# Patient Record
Sex: Male | Born: 1970 | Race: White
Health system: Southern US, Community
[De-identification: ages and names within clinical notes are randomized; demographics above are authoritative.]

## PROBLEM LIST (undated history)

## (undated) DIAGNOSIS — F1020 Alcohol dependence, uncomplicated: Secondary | ICD-10-CM

## (undated) DIAGNOSIS — I219 Acute myocardial infarction, unspecified: Secondary | ICD-10-CM

## (undated) DIAGNOSIS — I629 Nontraumatic intracranial hemorrhage, unspecified: Secondary | ICD-10-CM

## (undated) DIAGNOSIS — R319 Hematuria, unspecified: Secondary | ICD-10-CM

## (undated) DIAGNOSIS — K7460 Unspecified cirrhosis of liver: Secondary | ICD-10-CM

## (undated) HISTORY — DX: Nontraumatic intracranial hemorrhage, unspecified: I62.9

## (undated) HISTORY — PX: NASAL SINUS SURGERY: SHX719

---

## 2006-12-02 ENCOUNTER — Emergency Department (HOSPITAL_COMMUNITY): Admission: EM | Admit: 2006-12-02 | Discharge: 2006-12-02 | Payer: Self-pay | Admitting: Emergency Medicine

## 2006-12-08 ENCOUNTER — Emergency Department (HOSPITAL_COMMUNITY): Admission: EM | Admit: 2006-12-08 | Discharge: 2006-12-08 | Payer: Self-pay | Admitting: Emergency Medicine

## 2014-10-25 ENCOUNTER — Emergency Department
Admission: EM | Admit: 2014-10-25 | Discharge: 2014-10-26 | Disposition: A | Discharge status: Home / Self Care | Payer: Self-pay | Attending: Emergency Medicine | Admitting: Emergency Medicine

## 2014-10-25 ENCOUNTER — Emergency Department: Payer: Self-pay

## 2014-10-25 DIAGNOSIS — K7460 Unspecified cirrhosis of liver: Secondary | ICD-10-CM | POA: Insufficient documentation

## 2014-10-25 DIAGNOSIS — F10220 Alcohol dependence with intoxication, uncomplicated: Secondary | ICD-10-CM

## 2014-10-25 DIAGNOSIS — F10120 Alcohol abuse with intoxication, uncomplicated: Secondary | ICD-10-CM

## 2014-10-25 DIAGNOSIS — F10129 Alcohol abuse with intoxication, unspecified: Secondary | ICD-10-CM | POA: Insufficient documentation

## 2014-10-25 HISTORY — DX: Unspecified cirrhosis of liver: K74.60

## 2014-10-25 HISTORY — DX: Alcohol dependence, uncomplicated: F10.20

## 2014-10-25 HISTORY — DX: Hematuria, unspecified: R31.9

## 2014-10-25 LAB — CBC AND DIFFERENTIAL
Basophils Absolute Automated: 0.06 10*3/uL (ref 0.00–0.20)
Basophils Automated: 1 %
Eosinophils Absolute Automated: 0.06 10*3/uL (ref 0.00–0.70)
Eosinophils Automated: 1 %
Hematocrit: 48.3 % (ref 42.0–52.0)
Hgb: 16.8 g/dL (ref 13.0–17.0)
Immature Granulocytes Absolute: 0.01 10*3/uL
Immature Granulocytes: 0 %
Lymphocytes Absolute Automated: 3.35 10*3/uL (ref 0.50–4.40)
Lymphocytes Automated: 53 %
MCH: 32.4 pg — ABNORMAL HIGH (ref 28.0–32.0)
MCHC: 34.8 g/dL (ref 32.0–36.0)
MCV: 93.1 fL (ref 80.0–100.0)
MPV: 10.3 fL (ref 9.4–12.3)
Monocytes Absolute Automated: 0.48 10*3/uL (ref 0.00–1.20)
Monocytes: 8 %
Neutrophils Absolute: 2.35 10*3/uL (ref 1.80–8.10)
Neutrophils: 37 %
Nucleated RBC: 0 /100 WBC (ref 0–1)
Platelets: 323 10*3/uL (ref 140–400)
RBC: 5.19 10*6/uL (ref 4.70–6.00)
RDW: 14 % (ref 12–15)
WBC: 6.3 10*3/uL (ref 3.50–10.80)

## 2014-10-25 MED ORDER — SODIUM CHLORIDE 0.9 % IV BOLUS
1000.0000 mL | Freq: Once | INTRAVENOUS | Status: AC
Start: 2014-10-25 — End: 2014-10-26
  Administered 2014-10-25: 1000 mL via INTRAVENOUS

## 2014-10-25 MED ORDER — FAMOTIDINE 10 MG/ML IV SOLN (WRAP)
20.0000 mg | Freq: Once | INTRAVENOUS | Status: AC
Start: 2014-10-25 — End: 2014-10-25
  Administered 2014-10-25: 20 mg via INTRAVENOUS
  Filled 2014-10-25: qty 2

## 2014-10-25 MED ORDER — ONDANSETRON HCL 4 MG/2ML IJ SOLN
4.0000 mg | Freq: Once | INTRAMUSCULAR | Status: AC
Start: 2014-10-25 — End: 2014-10-25
  Administered 2014-10-25: 4 mg via INTRAVENOUS
  Filled 2014-10-25: qty 2

## 2014-10-25 NOTE — ED Notes (Signed)
Pt brought into ED from home by niece after pt vomiting and urinating blood and c/o of severe abd pains. Pt Spanish speaking only, niece translating, refuses facility interpreter. Niece describes uncle as having been a "binge drinker my whole life" and has been binge drinking currently for 1 month. Niece reports pt drinking approx 30 beers/day and a fifth of liquor/day. Pt appears tearful, but cooperative.

## 2014-10-25 NOTE — ED Provider Notes (Signed)
EMERGENCY DEPARTMENT HISTORY AND PHYSICAL EXAM    Date: 10/25/2014   Physician/Midlevel provider first contact with patient: 10/25/14 2243       Patient Name: Austin Reilly  Attending Physician: French Ana, MD  Mid-level: Tula Nakayama, PA-C      History of Presenting Illness     Chief Complaint   Patient presents with   . Alcohol Intoxication       History Provided By: patient and niece    Chief Complaint: alcohol abuse     Austin Reilly is a 44 y.o. male who presents with his niece for eval of alcohol abuse and pt wanting detox.  Per niece, pt has been drinking daily and getting drunk for a long time.  Pt has been drinking so much he urinates on himself.  Pt drinks liquor and beer.  Pt has recent begun to vomit and have blood in his stool.  Pt states he does have intermittent abd pain.  Pt denies any chest pain, SOB, back pain, or changes in urination.  Pt does not work due to workers comp injury and receives payment for this.  Pt does not drive.  Pt lives with his niece.    PCP: Pcp, Noneorunknown, [place]    No current facility-administered medications for this encounter.     No current outpatient prescriptions on file.       Past Medical History     Past Medical History   Diagnosis Date   . Cirrhosis    . Alcoholism    . Hematuria      History reviewed. No pertinent past surgical history.    Family History     No family history on file.    Social History     History     Social History   . Marital Status: Single     Spouse Name: N/A     Number of Children: N/A   . Years of Education: N/A     Social History Main Topics   . Smoking status: Never Smoker    . Smokeless tobacco: Not on file   . Alcohol Use: 138.0 oz/week     210 Cans of beer, 20 Shots of liquor per week   . Drug Use: Not on file   . Sexual Activity: Not on file     Other Topics Concern   . Not on file     Social History Narrative   . No narrative on file         Allergies     No Known Allergies    Review of Systems     Review of Systems   Constitutional:  Negative for fever.   HENT: Negative for nosebleeds.    Respiratory: Negative for shortness of breath.    Cardiovascular: Negative for chest pain and leg swelling.   Gastrointestinal: Positive for nausea, vomiting, abdominal pain and blood in stool.   Musculoskeletal: Negative for back pain, joint pain, falls and neck pain.   Neurological: Negative for dizziness, tingling, seizures, loss of consciousness and headaches.   Endo/Heme/Allergies: Does not bruise/bleed easily.   Psychiatric/Behavioral: Positive for substance abuse.   All other systems reviewed and are negative.          Physical Exam   BP 122/68 mmHg  Pulse 109  Temp(Src) 98.1 F (36.7 C) (Oral)  Resp 18  SpO2 95%    CONSTITUTIONAL:  Oriented to person, place, and time.  Well-developed and well-nourished.  No acute distress. Non-toxic appearance.  Vital signs reviewed.  Appears intoxicated.  RESP:  No respiratory distress.  Breath sounds with no rales, wheezing, or rhonchi. No chest wall tenderness.  CARDIAC:  Regular rate and rhythm.  Heart sounds with no murmurs, gallop, or friction rub.  ABD:  Normal bowel sounds x4 quadrants.  Soft.  Mild general abd tenderness..  Nondistended.  No organomegaly.  No peritoneal signs.  No signs of trauma or contusions.  No palpable pulsatile masses.    BACK:  No CVA tenderness.  Full range of motion without pain.  No spinous process tenderness, muscular tenderness, or spasm present.  MS:  No lacerations, contusions, or deformity.  Full active and passive range of motion. 2+ pulses.  No edema or calf tenderness.   SKIN:  Skin is warm and dry.  No rashes, lesions, contusions, or drainage.  NEURO:  A0X3.  Sensory intact throughout  PSYCH:  Mood, memory, affect, and judgment normal for age.      Diagnostic Study Results     Labs -     Results     Procedure Component Value Units Date/Time    Type and Screen [[id number]] Collected:  10/25/14 2319    Specimen Information:  Blood Updated:  10/26/14 0007     ABO Rh O POS       AB Screen Gel NEG     Comprehensive Metabolic Panel (CMP) [[id number]]  (Abnormal) Collected:  10/25/14 2319    Specimen Information:  Blood Updated:  10/26/14 0000     Glucose 113 (H) mg/dL      BUN 8.0 (L) mg/dL      Creatinine 0.9 mg/dL      Sodium 782 mEq/L      Potassium 3.9 mEq/L      Chloride 102 mEq/L      CO2 27 mEq/L      CALCIUM 9.3 mg/dL      Protein, Total 8.6 (H) g/dL      Albumin 4.2 g/dL      AST (SGOT) 83 (H) U/L      ALT 101 (H) U/L      Alkaline Phosphatase 100 U/L      Bilirubin, Total 1.0 mg/dL      Globulin 4.4 (H) g/dL      Albumin/Globulin Ratio 1.0      Anion Gap 13.0     Lipase [[id number]] Collected:  10/25/14 2319    Specimen Information:  Blood Updated:  10/26/14 0000     Lipase 38 U/L     Alcohol (Ethanol) Level [[id number]]  (Abnormal) Collected:  10/25/14 2319    Specimen Information:  Blood Updated:  10/26/14 0000     Alcohol 438 (HH) mg/dL     Hemolysis index [[id number]] Collected:  10/25/14 2319     Hemolysis Index 10 Updated:  10/26/14 0000    GFR [[id number]] Collected:  10/25/14 2319     EGFR >60.0 Updated:  10/26/14 0000    CBC and differential [[id number]]  (Abnormal) Collected:  10/25/14 2319    Specimen Information:  Blood / Blood Updated:  10/25/14 2327     WBC 6.30 x10 3/uL      Hgb 16.8 g/dL      Hematocrit 59.5 %      Platelets 323 x10 3/uL      RBC 5.19 x10 6/uL      MCV 93.1 fL      MCH 32.4 (H) pg      MCHC 34.8 g/dL  RDW 14 %      MPV 10.3 fL      Neutrophils 37 %      Lymphocytes Automated 53 %      Monocytes 8 %      Eosinophils Automated 1 %      Basophils Automated 1 %      Immature Granulocyte 0 %      Nucleated RBC 0 /100 WBC      Neutrophils Absolute 2.35 x10 3/uL      Abs Lymph Automated 3.35 x10 3/uL      Abs Mono Automated 0.48 x10 3/uL      Abs Eos Automated 0.06 x10 3/uL      Absolute Baso Automated 0.06 x10 3/uL      Absolute Immature Granulocyte 0.01 x10 3/uL           Radiologic Studies -   Radiology Results (24 Hour)     ** No results found for the  last 24 hours. **          Clinical Course in the Emergency Department     10:57 PM  Labs, Pepcid 20mg  IV, Zofran 4mg  IV, and NaCl 1 liter bolus ordered.    12:08 AM  Reviewed workup with pt and niece.  Will continue to hydrate and re-eval.    1:21 AM  NaCl #2 ordered.    3:36 AM  Reviewed with pt and niece.  Pt feeling better and able to converse without difficulty.  Pt without any n/v/d or bleeding in ED.  Niece willing to take pt home to [place].  Suggest f/u with detox in [place].  Advised to push PO and no alcohol use.  Return for worse pain, n/v, bleeding, shakes, withdrawal, or problems.  Reviewed case with Dr Johny Drilling.      Medical Decision Making     Differential diagnosis considered in this case:  Alcohol intoxication/abuse, GI bleed, hepatitis      I reviewed the vital signs, available nursing notes, past medical history, past surgical history, family history and social history.    Vital Signs-Reviewed the patient's vital signs.     Patient Vitals for the past 12 hrs:   BP Temp Pulse Resp   10/26/14 0132 122/68 mmHg - (!) 109 18   10/25/14 2256 (!) 164/110 mmHg 98.1 F (36.7 C) (!) 122 20         Diagnosis and Treatment Plan       Clinical Impression:   1. Alcohol intoxication in active alcoholic without complication            Attending's Note (MLP) Attestestation           Tula Nakayama, PA  10/26/14 1610    French Ana, MD  10/27/14 [phone]

## 2014-10-26 LAB — COMPREHENSIVE METABOLIC PANEL
ALT: 101 U/L — ABNORMAL HIGH (ref 0–55)
AST (SGOT): 83 U/L — ABNORMAL HIGH (ref 5–34)
Albumin/Globulin Ratio: 1 (ref 0.9–2.2)
Albumin: 4.2 g/dL (ref 3.5–5.0)
Alkaline Phosphatase: 100 U/L (ref 38–106)
Anion Gap: 13 (ref 5.0–15.0)
BUN: 8 mg/dL — ABNORMAL LOW (ref 9.0–28.0)
Bilirubin, Total: 1 mg/dL (ref 0.2–1.2)
CO2: 27 mEq/L (ref 22–29)
Calcium: 9.3 mg/dL (ref 8.5–10.5)
Chloride: 102 mEq/L (ref 100–111)
Creatinine: 0.9 mg/dL (ref 0.7–1.3)
Globulin: 4.4 g/dL — ABNORMAL HIGH (ref 2.0–3.6)
Glucose: 113 mg/dL — ABNORMAL HIGH (ref 70–100)
Potassium: 3.9 mEq/L (ref 3.5–5.1)
Protein, Total: 8.6 g/dL — ABNORMAL HIGH (ref 6.0–8.3)
Sodium: 142 mEq/L (ref 136–145)

## 2014-10-26 LAB — TYPE AND SCREEN
AB Screen Gel: NEGATIVE
ABO Rh: O POS

## 2014-10-26 LAB — GFR: EGFR: >60

## 2014-10-26 LAB — HEMOLYSIS INDEX: Hemolysis Index: 10 (ref 0–18)

## 2014-10-26 LAB — ETHANOL: Alcohol: 438 mg/dL

## 2014-10-26 LAB — LIPASE: Lipase: 38 U/L (ref 8–78)

## 2014-10-26 MED ORDER — SODIUM CHLORIDE 0.9 % IV BOLUS
1000.0000 mL | Freq: Once | INTRAVENOUS | Status: AC
Start: 2014-10-26 — End: 2014-10-26
  Administered 2014-10-26: 1000 mL via INTRAVENOUS

## 2014-10-26 NOTE — Discharge Instructions (Signed)
Intoxicacin por alcohol     Alcohol Intoxication     1.  Usted ha sido atendido por intoxicacin alcohlica.   1.  You have been seen for alcohol intoxication.             2.  La forma en que consumi alcohol hoy sugiere que puede tener un problema con el alcoholismo.   2.  Using alcohol as you did today suggests you might have a problem with alcohol abuse.             3.  El alcoholismo puede causar muchos problemas, incluyendo enfermedad del hgado, lceras estomacales y pancreatitis.   3.  Alcohol abuse can cause many problems including liver disease, stomach ulcers and pancreatitis.             4.  Consumir alcohol en exceso puede causarle un paro respiratorio y Musician.   4.  Drinking enough alcohol can cause you to stop breathing and die.             5.  Afortunadamente, hoy no sufri ninguna complicacin que ponga en riesgo su vida.   5.  Fortunately, you did not suffer any life-threatening complications today.             6.  NO CONDUZCA BAJO LOS EFECTOS DEL ALCOHOL! PUEDE LESIONARSE O MORIRSE, O BIEN LESIONAR O MATAR A ALGUIEN MS SI BEBE Y CONDUCE.   6.  DO NOT DRIVE A VEHICLE UNDER THE INFLUENCE OF ALCOHOL! YOU MIGHT INJURE OR KILL YOURSELF OR SOMEONE ELSE IF YOU DRINK AND DRIVE.             7.  DEBE BUSCAR ATENCIN MDICA INMEDIATAMENTE, AQU O EN LA SALA DE EMERGENCIAS MS CERCANA, SI SE PRESENTA CUALQUIERA DE LAS SIGUIENTES SITUACIONES:   7.  YOU SHOULD SEEK MEDICAL ATTENTION IMMEDIATELY, EITHER HERE OR AT THE NEAREST EMERGENCY DEPARTMENT, IF ANY OF THE FOLLOWING OCCURS:      * Bebe bastante como para perder el conocimiento o desmayarse.     * You drink enough to lose consciousness or black out.      * Siente confusin, letargo o tiene problemas para pensar, an cuando no ha bebido.     * You become confused or lethargic or have trouble thinking, even if you haven't been drinking.                        Pruebas de  funcin heptica anormal     Abnormal Liver Function Tests     1.  Se le realizaron pruebas de funcin heptica anormal.   1.  You had abnormal liver tests.             2.  El hgado es un rgano importante del cuerpo. El hgado metaboliza (descompone) los medicamentos y reduce las toxinas en su flujo sanguneo. El hgado tambin crea factores de coagulacin para detener los sangrados y [place] al almacenamiento de grasa y vitaminas. Se pueden hacer varias pruebas de sangre para ver cmo est trabajando su hgado. Las pruebas comunes incluyen AST (aspartato aminotransferasa) y ALT (alanina aminotransferasa). Estas son enzimas (protenas importantes de la sangre) que se encuentran en su hgado. Si hay una lesin, estas protenas y otras ms se filtran a su flujo sanguneo y pueden ser [place].   2.  Your liver is an important organ in your body. The liver metabolizes (breaks down) drugs and reduces toxins in your blood stream. The  liver also makes clotting factors to stop bleeding and helps with the storage of fat and vitamins. Several blood tests can be done to see how well your liver is working. Common tests include AST (aspartate aminotransferase) and ALT (alanine aminotransferase). These are enzymes (important proteins in the blood) that are found in your liver. If there is injury, these proteins and others leak into your blood stream and can be measured.             3.  Su mdico encontr una pequea anormalidad en sus pruebas hepticas. Algunas razones para esto son:   3.  Your doctor found a small abnormality in your liver tests. Some reasons for this are:      * Sobredosis o consumo excesivo de acetaminofeno (Tylenol).    * Acetaminophen (Tylenol) overdose or too much use.      * Abuso de alcohol o ingerir demasiado.    * Abusing alcohol or using it too much.      * Hepatitis crnica.    * Chronic hepatitis.      * Hgado graso debido a diabetes u obesidad.    *  Fatty liver because of diabetes or obesity.      * Algunos medicamentos, como anticonvulsivos, antibiticos o medicamentos para el colesterol.    * Some medications like anti-seizure medications, antibiotics or cholesterol medications.             4.  Su mdico no cree que se necesite hacer nada ms en este momento para sus funciones hepticas elevadas. Es posible que necesite otro examen o ms pruebas para determinar la causa por la cual se present este resultado de laboratorio anormal. En este momento, la causa de sus resultados de laboratorio no parece ser peligrosa. No necesita permanecer en el hospital.   4.  Your doctor does not feel that anything needs to be done about your elevated liver functions right away. You might need another exam or more tests to find out why you have this abnormal lab result. At this time, the cause of your laboratory results does not seem dangerous. You do not need to stay in the hospital.             5.  En algunos casos, el resultado de una funcin heptica anormal puede ser una seal de algo serio o una enfermedad. Es por eso que es muy importante que su mdico de atencin primaria lo vigile. [employer] sus resultados de laboratorio anormales.   5.  In some cases, an abnormal liver function result can be a sign of something serious or an illness. This is why it s important for your primary doctor to keep a close eye on you. The doctor will need to recheck your abnormal laboratory result.             6.  Usted necesitar darle seguimiento con su mdico para que vuelvan a revisar sus pruebas hepticas. Necesitar que se le hagan ms pruebas para descubrir el motivo por el cual sus pruebas hepticas resultaron anormales. Tambin debera decirle a su mdico de atencin [employer] de su prueba inmediatamente.   6.  You will need to follow up with your doctor to have your liver tests rechecked.  You will need to have more tests done to figure out why your liver tests are abnormal. You should also let your primary care doctor know about your abnormal test results right away.  7.  Si le recetaron medicamentos, por favor tmelos de acuerdo con las indicaciones.    7.  If medication is prescribed for you, please take it as instructed.              8.  DEBE BUSCAR ATENCIN MDICA INMEDIATAMENTE, AQU O EN LA SALA DE EMERGENCIAS MS CERCANA, SI SE PRESENTA CUALQUIERA DE LAS SIGUIENTES SITUACIONES:   8.  YOU SHOULD SEEK MEDICAL ATTENTION IMMEDIATELY, EITHER HERE OR AT THE NEAREST EMERGENCY DEPARTMENT, IF ANY OF THE FOLLOWING OCCUR:      * Si no puede llevar sus pruebas de funcin heptica para que las revisen nuevamente en el tiempo que recomend hoy su mdico.    * You can t get your liver function tests rechecked in the time period your doctor recommended today.      * Si nota que su piel o sus ojos se ponen amarillos (ictericia).    * You notice your skin or eyes turning yellow (jaundice).      * Si se le hacen moretones fcilmente o si tiene sangrados.    * You have easy bruising or bleeding.      * Si tiene dolor abdominal (de estmago) muy fuerte.    * You have severe abdominal (belly) pain.      * Si tiene dolor de pecho o le falta el [place].    * You have chest pain or shortness of breath.             9.  Si no puede dar seguimiento con su mdico, o si en cualquier momento cree que necesita una nueva revisin o ser atendido de Joanna, venga aqu o acuda a la sala de emergencias ms cercana.   9.  If you can t follow up with your doctor, or if at any time you feel you need to be rechecked or seen again, come back here or go to the nearest emergency department.                   [facility] FOH Detox Alcohol Etoh Treatment     IFH FOH Detox Alcohol Etoh Treatment     1.  Usted ha sido referido para recibir tratamientos para el abuso de alcohol u  otras sustancias. Vea  los nmeros telefnicos de referido que se detallan a continuacin. Favor de llamarnos lo antes  posible para hacer los arreglos relacionados con Scientist, research (medical).    PROGRAMAS DE DESINTOXICACIN PARA LOS PACIENTES SIN SEGURO  [place] [facility]Social Detox....Marland KitchenMarland [phone]  [facility] Social Detox.... [phone]  [facility].Marland KitchenMarland [phone]    CENTROS DE TRATAMIENTO DE ALCOHOLISMO PARA PACIENTES SIN  SEGURO  [facility] .Marland KitchenMarland KitchenMarland [phone]  [facility] ADS....[phone]  [place] ADS...[phone]  [place] ADS.Marland [phone]  [place] ADS..[phone]  [place] ADS....[phone]  [facility] ADS/Mental Health.Marland Kitchen..[phone]    SERVICIOS RELACIONADOS CON EL ABUSO DE SUSTANCIAS PARA LOS  PACIENTES CON SEGURO    CATS (SERVICIOS DE TRATAMIENTOS [place] LA ADICCIN)  DESINTOXICACIN [employer], Y TRATAMIENTOS EXTERNOS Y  DIURNOS  [address]  [place], [place] [phone]    [facility] CENTER  DESINTOXICACIN EN [facility] Y TRATAMIENTOS DIURNOS  [phone]  [place], [place] [phone]    [facility]  DESINTOXICACIN [facility]  [address]  [place], [place] [phone]    [facility]  DESINTOXICACIN EXTERNA Y TRATAMIENTOS DIURNOS PARA EL ABUSO DE  ALCOHOL U OPICEOS  [phone], con acceso por la lnea  roja del [facility] [place]  TRATAMIENTOS Jennelle Human DE VIDA SOBRIA  [address]  [place] [address] [phone]    Reuniones de grupos de los 12 pasos  AA Intergroup... [phone]  Al-Anon..[phone]  D.C. AA Intergroup .Marland [phone]  NA Intergroup de [facility]/[place]/ [place]..[phone]    Network of Care-http://[place].networkofcare.org  Un recurso para individuos, familias y agencias que abarca asuntos de salud mental, abuso de  sustancias, discapacidades de desarrollo, y trastornos  concurrentes.    2-1-1 [place] -http://211[place].org  2-1-1 es un servicio ofrecido a travs del telfono y Dance movement psychotherapist internet para vincular a Dealer con  informacin acerca de los servicios disponibles de salud comunitaria.    Alcoholics Anonymous -http://aa.org  Alcoholics Anonymous (AA) es una asociacin global de damas y caballeros que comparten el  deseo de dejar de tomar alcohol y Architect la sobriedad.    Narcotics Anonymous - https://kidd.org/  Narcotics Anonymous (NA) es una asociacin global de damas y caballeros que comparten un  deseo de dejar de abusar las drogas, y Architect la sobriedad.    Narcotics Anonymous - [place]  SwimChampionship.fr  ([phone]    Vuelva de inmediato a la Sala de Emergencias si tiene:  - Incremento de temblores o tremores  - Convulsiones  - Vmito incontrolable  - Si piensa en lesionarse a s mismo o a otra persona  - Dolores abdominales   1.  You have been referred for alcohol or substance abuse treatment. See referral phone numbers below. Please call as soon as possible to arrange for treatment.    DETOX PROGRAMS FOR UNINSURED PATIENTS  [place] [facility]/Social Detox....Marland KitchenMarland [phone]  [facility] Social Detox.... [phone]  [facility].Marland KitchenMarland [phone]    OUTPATIENT ALCOHOL TREATMENT CENTERS FOR UNINSURED PATIENTS  [facility] .Marland KitchenMarland KitchenMarland [phone]  [facility] ADS....[phone]  [place] ADS...[phone]  [place] Lauber ADS.Marland [phone]  [place] ADS..[phone]  [place] ADS....[phone]  [facility] ADS/Mental Health.Marland Kitchen..[phone]      SUBSTANCE ABUSE SERVICES FOR INSURED PATIENTS    CATS (COMPREHENSIVE ADDICTION TREATMENT SERVICES)  INPATIENT DETOX, DAY TREATMENT AND OUTPATIENT  [address]  [place], [place] [phone]    [facility] CENTER  INPATIENT DETOX AND DAY TREATMENT  [address]  [place], [place]  [phone]    [facility]  INPATIENT DETOX  [address]  [place], [place] [phone]    [facility] CLINIC  ALCOHOL/OPIATE  DETOX OUTPATIENT AND DAY TREATMENT  [phone] [place] red line access    [facility] [place]  DAY TREATMENT AND SOBER LIVING  [address]  [place] [address] [phone]      12 Step Meetings  AA Intergroup... [phone]  Al-Anon..[phone]  D.C. AA Intergroup .Marland [phone]  [place]/ [place] NA Intergroup..[phone]    Network of Care-http://[place].networkofcare.org  A resource for individuals, families and agencies concerned with mental health, substance abuse, developmental disabilities, and co-occurring disorders.     2-1-1 [place] -http://211[place].org  2-1-1 is a phone and web-based service connecting people with free information on available and health community services.    Alcoholics Anonymous -http://aa.org  Alcoholics Anonymous (AA) is a worldwide fellowship of men and women who share a desire to stop drinking alcohol, and subsequently maintain their sobriety.    Narcotics Anonymous - https://kidd.org/  Narcotics Anonymous (NA) is a worldwide fellowship of men and women who share a desire to stop using drugs, and subsequently maintain their sobriety.    Narcotics Anonymous - [employer]  [place]  SwimChampionship.fr  [phone]    Return immediately to the Emergency Department for:  - Increased shaking or tremors  - Seizure  - Uncontrollable vomiting  - Thoughts about hurting yourself or hurting someone else  - Abdominal pain

## 2015-06-27 ENCOUNTER — Inpatient Hospital Stay
Admission: EM | Admit: 2015-06-27 | Discharge: 2015-06-30 | DRG: 391 | Disposition: A | Discharge status: Home / Self Care | Payer: Charity | Attending: Internal Medicine | Admitting: Internal Medicine

## 2015-06-27 ENCOUNTER — Inpatient Hospital Stay: Payer: Charity | Admitting: Geriatric Medicine

## 2015-06-27 DIAGNOSIS — F10920 Alcohol use, unspecified with intoxication, uncomplicated: Secondary | ICD-10-CM

## 2015-06-27 DIAGNOSIS — K7031 Alcoholic cirrhosis of liver with ascites: Secondary | ICD-10-CM

## 2015-06-27 DIAGNOSIS — K2920 Alcoholic gastritis without bleeding: Principal | ICD-10-CM | POA: Diagnosis present

## 2015-06-27 DIAGNOSIS — Z789 Other specified health status: Secondary | ICD-10-CM

## 2015-06-27 DIAGNOSIS — F10230 Alcohol dependence with withdrawal, uncomplicated: Secondary | ICD-10-CM | POA: Diagnosis present

## 2015-06-27 DIAGNOSIS — R000 Tachycardia, unspecified: Secondary | ICD-10-CM | POA: Diagnosis present

## 2015-06-27 DIAGNOSIS — F10939 Alcohol use, unspecified with withdrawal, unspecified: Secondary | ICD-10-CM | POA: Diagnosis present

## 2015-06-27 DIAGNOSIS — Z23 Encounter for immunization: Secondary | ICD-10-CM

## 2015-06-27 DIAGNOSIS — F10930 Alcohol use, unspecified with withdrawal, uncomplicated: Secondary | ICD-10-CM

## 2015-06-27 DIAGNOSIS — K7010 Alcoholic hepatitis without ascites: Secondary | ICD-10-CM | POA: Diagnosis present

## 2015-06-27 DIAGNOSIS — K226 Gastro-esophageal laceration-hemorrhage syndrome: Secondary | ICD-10-CM | POA: Diagnosis present

## 2015-06-27 DIAGNOSIS — K5900 Constipation, unspecified: Secondary | ICD-10-CM | POA: Diagnosis present

## 2015-06-27 LAB — URINALYSIS, REFLEX TO MICROSCOPIC EXAM IF INDICATED
Bilirubin, UA: NEGATIVE
Blood, UA: NEGATIVE
Glucose, UA: NEGATIVE
Ketones UA: NEGATIVE
Leukocyte Esterase, UA: NEGATIVE
Nitrite, UA: NEGATIVE
Protein, UR: 30 — AB
Specific Gravity UA: 1.003 (ref 1.001–1.035)
Urine pH: 7 (ref 5.0–8.0)
Urobilinogen, UA: NEGATIVE mg/dL

## 2015-06-27 LAB — CBC AND DIFFERENTIAL
Basophils Absolute Automated: 0.06 10*3/uL (ref 0.00–0.20)
Basophils Automated: 1 %
Eosinophils Absolute Automated: 0.05 10*3/uL (ref 0.00–0.70)
Eosinophils Automated: 1 %
Hematocrit: 46.6 % (ref 42.0–52.0)
Hgb: 16.1 g/dL (ref 13.0–17.0)
Immature Granulocytes Absolute: 0.01 10*3/uL
Immature Granulocytes: 0 %
Lymphocytes Absolute Automated: 1.51 10*3/uL (ref 0.50–4.40)
Lymphocytes Automated: 26 %
MCH: 31 pg (ref 28.0–32.0)
MCHC: 34.5 g/dL (ref 32.0–36.0)
MCV: 89.6 fL (ref 80.0–100.0)
MPV: 10.4 fL (ref 9.4–12.3)
Monocytes Absolute Automated: 0.56 10*3/uL (ref 0.00–1.20)
Monocytes: 10 %
Neutrophils Absolute: 3.6 10*3/uL (ref 1.80–8.10)
Neutrophils: 62 %
Nucleated RBC: 0 /100 WBC (ref 0–1)
Platelets: 176 10*3/uL (ref 140–400)
RBC: 5.2 10*6/uL (ref 4.70–6.00)
RDW: 14 % (ref 12–15)
WBC: 5.78 10*3/uL (ref 3.50–10.80)

## 2015-06-27 LAB — LIPASE: Lipase: 58 U/L (ref 8–78)

## 2015-06-27 LAB — GFR: EGFR: >60

## 2015-06-27 LAB — COMPREHENSIVE METABOLIC PANEL
ALT: 94 U/L — ABNORMAL HIGH (ref 0–55)
AST (SGOT): 139 U/L — ABNORMAL HIGH (ref 5–34)
Albumin/Globulin Ratio: 1 (ref 0.9–2.2)
Albumin: 4 g/dL (ref 3.5–5.0)
Alkaline Phosphatase: 100 U/L (ref 38–106)
Anion Gap: 16 — ABNORMAL HIGH (ref 5.0–15.0)
BUN: 6 mg/dL — ABNORMAL LOW (ref 9.0–28.0)
Bilirubin, Total: 2.2 mg/dL — ABNORMAL HIGH (ref 0.2–1.2)
CO2: 22 mEq/L (ref 22–29)
Calcium: 9.2 mg/dL (ref 8.5–10.5)
Chloride: 105 mEq/L (ref 100–111)
Creatinine: 0.8 mg/dL (ref 0.7–1.3)
Globulin: 4.1 g/dL — ABNORMAL HIGH (ref 2.0–3.6)
Glucose: 96 mg/dL (ref 70–100)
Potassium: 3.7 mEq/L (ref 3.5–5.1)
Protein, Total: 8.1 g/dL (ref 6.0–8.3)
Sodium: 143 mEq/L (ref 136–145)

## 2015-06-27 LAB — B-TYPE NATRIURETIC PEPTIDE: B-Natriuretic Peptide: <10 pg/mL (ref 0–100)

## 2015-06-27 LAB — LACTATE DEHYDROGENASE: LDH: 301 U/L — ABNORMAL HIGH (ref 125–220)

## 2015-06-27 LAB — HEMOLYSIS INDEX: Hemolysis Index: 6 (ref 0–18)

## 2015-06-27 LAB — BILIRUBIN, DIRECT: Bilirubin Direct: 0.9 mg/dL — ABNORMAL HIGH (ref 0.0–0.5)

## 2015-06-27 LAB — ETHANOL: Alcohol: 308 mg/dL — ABNORMAL HIGH

## 2015-06-27 MED ORDER — ONDANSETRON HCL 4 MG/2ML IJ SOLN
4.0000 mg | INTRAMUSCULAR | Status: DC | PRN
Start: 2015-06-27 — End: 2015-06-28

## 2015-06-27 MED ORDER — THIAMINE HCL 100 MG/ML IJ SOLN
1000.0000 mL/h | Freq: Once | INTRAVENOUS | Status: AC
Start: 2015-06-27 — End: 2015-06-27
  Administered 2015-06-27: 1000 mL/h via INTRAVENOUS
  Filled 2015-06-27: qty 1000

## 2015-06-27 MED ORDER — SODIUM CHLORIDE 0.9 % IV BOLUS
2000.0000 mL | Freq: Once | INTRAVENOUS | Status: AC
Start: 2015-06-27 — End: 2015-06-27
  Administered 2015-06-27: 2000 mL via INTRAVENOUS

## 2015-06-27 MED ORDER — DEXTROSE-SODIUM CHLORIDE 5-0.45 % IV SOLN
100.0000 mL/h | INTRAVENOUS | Status: AC
Start: 2015-06-28 — End: 2015-06-29
  Administered 2015-06-28: 100 mL/h via INTRAVENOUS

## 2015-06-27 MED ORDER — DIAZEPAM 5 MG/ML IJ SOLN
5.0000 mg | Freq: Four times a day (QID) | INTRAMUSCULAR | Status: DC
Start: 2015-06-28 — End: 2015-06-27

## 2015-06-27 MED ORDER — DIAZEPAM 5 MG/ML IJ SOLN
5.0000 mg | Freq: Four times a day (QID) | INTRAMUSCULAR | Status: DC
Start: 2015-06-27 — End: 2015-06-28
  Administered 2015-06-27: 5 mg via INTRAVENOUS
  Filled 2015-06-27: qty 2

## 2015-06-27 MED ORDER — SODIUM CHLORIDE 0.9 % IV BOLUS
1000.0000 mL | Freq: Once | INTRAVENOUS | Status: AC
Start: 2015-06-27 — End: 2015-06-27
  Administered 2015-06-27: 1000 mL via INTRAVENOUS

## 2015-06-27 NOTE — ED Notes (Signed)
Austin Reilly is a 44 y.o. male who presents to ED with c/o lower abdominal pain for the past month. Per the niece pt has been steadily drinking for the past month, she states he drinks 9 large bottles of beer per day, reports he has not had a bowel movement for the past month. Niece reports pt has not been eating. BP 143/89 mmHg  Pulse 120  Temp(Src) 98.1 F (36.7 C) (Oral)  Resp 18  Ht 5\' 2"  (1.575 m)  Wt 63.504 kg  BMI 25.60 kg/m2  SpO2 95%

## 2015-06-27 NOTE — ED Provider Notes (Signed)
EMERGENCY DEPARTMENT HISTORY AND PHYSICAL EXAM     Physician/Midlevel provider first contact with patient: 06/27/15 1848         Date: 06/27/2015  Patient Name: Austin Reilly    History of Presenting Illness     Chief Complaint   Patient presents with   . Abdominal Pain   . Alcohol Intoxication     History Provided By: Patient and patient's neice    Due to language barrier, the appropriate medical translator or service was utilized to ensure accurate information was obtained and communication was unimpeded throughout the emergency department visit.    Chief Complaint: Abd pain  Onset: about 30 days  Timing: Intermittent  Location: Diffuse  Quality: Burning pain  Severity: 8/10 pain  Exacerbating factors: Worse when he stops drinking alcohol  Alleviating factors: Better when drinking alcohol and laying down  Associated Symptoms: Nausea, vomit, lightheadedness, alcohol intoxication, constipation  Pertinent Negatives: Blood in stool, blood in vomit, nose bleeds, bruising easy,     Additional History: Austin Reilly is a 44 y.o. male pt who has a hx of liver cirrhosis c/o intermittent 8/10 diffuse abd pain x about 30 days. Associated symptoms include nausea, vomiting, constipation, and lightheadedness. Pt states that he has been drinking about 10 beers everyday for the past 30 days. He states that the pain and vomiting starts when he "doesn't have alcohol in his system". He states his symptoms improve "as soon as he drinks alcohol" and when he lays down. His last alcoholic beverage was 1 hour ago. His last 'normal' BM was 30 days ago. Pt has not had any empty pill bottles lying around, per niece. Niece is very concerned and states pt lives with her and refuses to stop drinking.     Denies blood in stool, blood in vomit, nose bleeds, bruising easy, chest pain, SOB.    PCP: Pcp, Noneorunknown, MD    Current Facility-Administered Medications   Medication Dose Route Frequency Provider Last Rate Last Dose   . diazepam (VALIUM)  injection 5 mg  5 mg Intravenous Q6H Juliane Poot, MD PHD       . sodium chloride 0.9 % bolus 1,000 mL  1,000 mL Intravenous Once Juliane Poot, MD PHD         No current outpatient prescriptions on file.       Past History     Past Medical History:  Past Medical History   Diagnosis Date   . Cirrhosis    . Alcoholism    . Hematuria        Past Surgical History:  History reviewed. No pertinent past surgical history.    Family History:  History reviewed. No pertinent family history.    Social History:  Social History   Substance Use Topics   . Smoking status: Never Smoker    . Smokeless tobacco: None   . Alcohol Use: 138.0 oz/week     210 Cans of beer, 20 Shots of liquor per week       Allergies:  No Known Allergies    Review of Systems     Review of Systems   Constitutional: Negative for fever and chills.   HENT: Negative for congestion, sore throat, trouble swallowing and voice change.    Eyes: Negative for redness and visual disturbance.   Respiratory: Negative for cough, chest tightness and shortness of breath.    Cardiovascular: Negative for chest pain, palpitations and leg swelling.   Gastrointestinal: Positive for nausea, vomiting, abdominal  pain and constipation.   Endocrine: Negative for polydipsia and polyuria.   Genitourinary: Negative for dysuria, urgency, hematuria and flank pain.   Musculoskeletal: Negative for back pain and neck pain.   Skin: Negative for color change and rash.   Allergic/Immunologic: Negative for immunocompromised state.   Neurological: Positive for light-headedness. Negative for dizziness, syncope, weakness and numbness.   Hematological: Negative for adenopathy.   Psychiatric/Behavioral: Negative for confusion and agitation.        +alcohol intoxication   All other systems reviewed and are negative.    Physical Exam   BP 139/81 mmHg  Pulse 110  Temp(Src) 97.8 F (36.6 C) (Oral)  Resp 16  Ht 5\' 2"  (1.575 m)  Wt 63.504 kg  BMI 25.60 kg/m2  SpO2 97%    Physical Exam    Constitutional: He appears well-developed and well-nourished. No distress.   HENT:   Head: Normocephalic and atraumatic.   Mouth/Throat: Oropharynx is clear and moist.   Eyes: Conjunctivae are normal. Pupils are equal, round, and reactive to light.   Neck: Normal range of motion. Neck supple. No JVD present.   Cardiovascular: Normal rate, regular rhythm, normal heart sounds and intact distal pulses.    Pulmonary/Chest: Effort normal and breath sounds normal. No respiratory distress.   Abdominal: Soft. Bowel sounds are normal. He exhibits no distension. There is tenderness (diffuse TTP, upper worse than lower).   Musculoskeletal: Normal range of motion. He exhibits no edema or tenderness.   Lymphadenopathy:     He has no cervical adenopathy.   Neurological: He is alert. He exhibits normal muscle tone.   Inebriated. Answering questions appropriately. Cooperative.    Skin: Skin is warm and dry. No rash noted. He is not diaphoretic.   Psychiatric: He has a normal mood and affect. His behavior is normal.   Nursing note and vitals reviewed.    Diagnostic Study Results     Labs -     Results     Procedure Component Value Units Date/Time    Bilirubin, direct [[id number]]  (Abnormal) Collected:  06/27/15 1934     Bilirubin, Direct 0.9 (H) mg/dL Updated:  02/72/53 6644    UA, Reflex to Microscopic (pts 3 + yrs) [[id number]]  (Abnormal) Collected:  06/27/15 1954    Specimen Information:  Urine Updated:  06/27/15 2029     Urine Type Clean Catch      Color, UA Straw      Clarity, UA Clear      Specific Gravity UA 1.003      Urine pH 7.0      Leukocyte Esterase, UA Negative      Nitrite, UA Negative      Protein, UR 30 (A)      Glucose, UA Negative      Ketones UA Negative      Urobilinogen, UA Negative mg/dL      Bilirubin, UA Negative      Blood, UA Negative      RBC, UA 0 - 5 /hpf      WBC, UA 0 - 5 /hpf     B-type Natriuretic Peptide (BNP) [[id number]] Collected:  06/27/15 1934    Specimen Information:  Blood Updated:   06/27/15 2013     B-Natriuretic Peptide <10 pg/mL     Comprehensive Metabolic Panel (CMP) [[id number]]  (Abnormal) Collected:  06/27/15 1934    Specimen Information:  Blood Updated:  06/27/15 2006     Glucose 96 mg/dL  BUN 6.0 (L) mg/dL      Creatinine 0.8 mg/dL      Sodium 161 mEq/L      Potassium 3.7 mEq/L      Chloride 105 mEq/L      CO2 22 mEq/L      Calcium 9.2 mg/dL      Protein, Total 8.1 g/dL      Albumin 4.0 g/dL      AST (SGOT) 096 (H) U/L      ALT 94 (H) U/L      Alkaline Phosphatase 100 U/L      Bilirubin, Total 2.2 (H) mg/dL      Globulin 4.1 (H) g/dL      Albumin/Globulin Ratio 1.0      Anion Gap 16.0 (H)     Lipase [[id number]] Collected:  06/27/15 1934    Specimen Information:  Blood Updated:  06/27/15 2006     Lipase 58 U/L     Lactate Dehydrogenase (LDH) [[id number]]  (Abnormal) Collected:  06/27/15 1934    Specimen Information:  Blood Updated:  06/27/15 2006     LDH 301 (H) U/L     Hemolysis index [[id number]] Collected:  06/27/15 1934     Hemolysis Index 6 Updated:  06/27/15 2006    GFR [[id number]] Collected:  06/27/15 1934     EGFR >60.0 Updated:  06/27/15 2006    Alcohol (Ethanol) Level [[id number]]  (Abnormal) Collected:  06/27/15 1934    Specimen Information:  Blood Updated:  06/27/15 2006     Alcohol 308 (H) mg/dL     CBC and differential [[id number]] Collected:  06/27/15 1934    Specimen Information:  Blood from Blood Updated:  06/27/15 1950     WBC 5.78 x10 3/uL      Hgb 16.1 g/dL      Hematocrit 03.4 %      Platelets 176 x10 3/uL      RBC 5.20 x10 6/uL      MCV 89.6 fL      MCH 31.0 pg      MCHC 34.5 g/dL      RDW 14 %      MPV 10.4 fL      Neutrophils 62 %      Lymphocytes Automated 26 %      Monocytes 10 %      Eosinophils Automated 1 %      Basophils Automated 1 %      Immature Granulocyte 0 %      Nucleated RBC 0 /100 WBC      Neutrophils Absolute 3.60 x10 3/uL      Abs Lymph Automated 1.51 x10 3/uL      Abs Mono Automated 0.56 x10 3/uL      Abs Eos Automated 0.05 x10 3/uL      Absolute  Baso Automated 0.06 x10 3/uL      Absolute Immature Granulocyte 0.01 x10 3/uL           Radiologic Studies -   Radiology Results (24 Hour)     ** No results found for the last 24 hours. **      .      Medical Decision Making   I am the first provider for this patient.    I reviewed the vital signs, available nursing notes, past medical history, past surgical history, family history and social history.    Vital Signs: Reviewed the patient's vital signs.     Patient Vitals for the past  12 hrs:   BP Temp Pulse Resp   06/27/15 2154 139/81 mmHg 97.8 F (36.6 C) (!) 110 16   06/27/15 1936 138/81 mmHg - (!) 109 (!) 23   06/27/15 1855 143/89 mmHg 98.1 F (36.7 C) (!) 120 18       Pulse Oximetry Analysis:  95% on RA    Cardiac Monitor:  Rate: 108 bpm  Rhythm:  Sinus Tachycardia     EKG:  Interpreted by the Emergency Physician.   Time Interpreted: 1843   Rate: 123   Rhythm: Sinus Tachycardia    Interpretation: QRS 94, QTc 420, no ST elevations or depressions   Comparison: No prior study is available for comparison.      Core Measures: 12-lead EKG was performed in the ED. Aspirin was not given (noncardiac).    Old Medical Records: Nursing notes. Old medical records.    ED Course:   8:41 PM - Discussed plan for admission with patient and family for alcohol withdrawal, they agree.     10:21 PM - D/w Dr. Karie Mainland ([facility]), agrees with admission (imcu inpt).     Provider Notes: 31M with known liver cirrhosis (diagnosed several months ago) and with EtOH abuse presents intoxicated, last drink 1 hour PTA. On arrival pt tachycardic to 120s, agitated and asking for alcohol. He states he will 'feel better' if he drinks. Exam with mild diffuse TTP, worst in epigastrium. Labs with normal lipase, but elevated LDH, Tbili and LFTs, EtOH 308. On arrival pt was agressively resuscitated with IVFs, given a banana bag and placed on seizure and fall precautions. He was also started on CIWA protocol and given a dose of valium. Will admit for EtOH  withdrawal management. Pt and niece agree with the plan.       Diagnosis     Clinical Impression:   1. Alcohol withdrawal, uncomplicated    2. Alcohol intoxication, uncomplicated    3. Tachycardia    4. Alcohol ingestion, more than 4 drinks/day    5. Acute alcoholic gastritis without hemorrhage        Treatment Plan:   ED Disposition     Admit Admitting Physician: Rolan Lipa A [id number]  Diagnosis: Alcohol withdrawal [291.81.ICD-9-CM]  Estimated Length of Stay: > or = to 2 midnights  Tentative Discharge Plan?: Home or Self Care [1]  Patient Class: Inpatient [101]              _______________________________    Attestations: This note is prepared by Rosine Abe acting as scribe for Ardis Hughs, MD, PHD. The scribe's documentation has been prepared under my direction and personally reviewed by me in its entirety.  I confirm that the note above accurately reflects all work, treatment, procedures, and medical decision making performed by me.    _______________________________               Juliane Poot, MD [facility]  06/30/15 [phone]

## 2015-06-28 ENCOUNTER — Inpatient Hospital Stay: Payer: Charity

## 2015-06-28 DIAGNOSIS — F10230 Alcohol dependence with withdrawal, uncomplicated: Secondary | ICD-10-CM

## 2015-06-28 DIAGNOSIS — F10920 Alcohol use, unspecified with intoxication, uncomplicated: Secondary | ICD-10-CM

## 2015-06-28 DIAGNOSIS — K2920 Alcoholic gastritis without bleeding: Secondary | ICD-10-CM

## 2015-06-28 DIAGNOSIS — F10120 Alcohol abuse with intoxication, uncomplicated: Secondary | ICD-10-CM

## 2015-06-28 DIAGNOSIS — K7031 Alcoholic cirrhosis of liver with ascites: Secondary | ICD-10-CM

## 2015-06-28 LAB — HEMOLYSIS INDEX
Hemolysis Index: 4 (ref 0–18)
Hemolysis Index: 15 (ref 0–18)

## 2015-06-28 LAB — CBC WITH MANUAL DIFFERENTIAL
Band Neutrophils Absolute: 0 10*3/uL (ref 0.00–1.00)
Band Neutrophils: 0 %
Basophils Absolute Manual: 0 10*3/uL (ref 0.00–0.20)
Basophils Manual: 0 %
Cell Morphology: NORMAL
Eosinophils Absolute Manual: 0 10*3/uL (ref 0.00–0.70)
Eosinophils Manual: 0 %
Hematocrit: 43.4 % (ref 42.0–52.0)
Hgb: 14.4 g/dL (ref 13.0–17.0)
Lymphocytes Absolute Manual: 1.44 10*3/uL (ref 0.50–4.40)
Lymphocytes Manual: 28 %
MCH: 30.2 pg (ref 28.0–32.0)
MCHC: 33.2 g/dL (ref 32.0–36.0)
MCV: 91 fL (ref 80.0–100.0)
MPV: 10.8 fL (ref 9.4–12.3)
Metamyelocytes Absolute: 0.05 10*3/uL — ABNORMAL HIGH
Metamyelocytes: 1 %
Monocytes Absolute: 0.21 10*3/uL (ref 0.00–1.20)
Monocytes Manual: 4 %
Myelocytes Absolute: 0.05 10*3/uL — ABNORMAL HIGH
Myelocytes: 1 %
Neutrophils Absolute Manual: 3.39 10*3/uL (ref 1.80–8.10)
Nucleated RBC: 0 /100 WBC (ref 0–1)
Platelets: 158 10*3/uL (ref 140–400)
RBC: 4.77 10*6/uL (ref 4.70–6.00)
RDW: 14 % (ref 12–15)
Segmented Neutrophils: 66 %
WBC: 5.14 10*3/uL (ref 3.50–10.80)

## 2015-06-28 LAB — RAPID DRUG SCREEN, URINE
Barbiturate Screen, UR: NEGATIVE
Benzodiazepine Screen, UR: NEGATIVE
Cannabinoid Screen, UR: NEGATIVE
Cocaine, UR: NEGATIVE
Opiate Screen, UR: NEGATIVE
PCP Screen, UR: NEGATIVE
Urine Amphetamine Screen: NEGATIVE

## 2015-06-28 LAB — MAGNESIUM
Magnesium: 1.8 mg/dL (ref 1.6–2.6)
Magnesium: 1.8 mg/dL (ref 1.6–2.6)

## 2015-06-28 LAB — PT/INR
PT INR: 1 (ref 0.9–1.1)
PT: 13.6 s (ref 12.6–15.0)

## 2015-06-28 MED ORDER — LORAZEPAM 2 MG/ML IJ SOLN
1.0000 mg | INTRAMUSCULAR | Status: DC | PRN
Start: 2015-06-28 — End: 2015-06-28
  Administered 2015-06-28: 1 mg via INTRAVENOUS
  Filled 2015-06-28: qty 1

## 2015-06-28 MED ORDER — INFLUENZA VAC SPLIT QUAD 0.5 ML IM SUSY
0.5000 mL | PREFILLED_SYRINGE | Freq: Once | INTRAMUSCULAR | Status: AC
Start: 2015-06-28 — End: 2015-06-28
  Administered 2015-06-28: 0.5 mL via INTRAMUSCULAR

## 2015-06-28 MED ORDER — CIPROFLOXACIN HCL 500 MG PO TABS
500.0000 mg | ORAL_TABLET | ORAL | Status: DC
Start: 2015-06-28 — End: 2015-06-30
  Administered 2015-06-28 – 2015-06-30 (×3): 500 mg via ORAL
  Filled 2015-06-28 (×3): qty 1

## 2015-06-28 MED ORDER — INFUVITE ADULT IV INJ
INJECTION | INTRAVENOUS | Status: DC
Start: 2015-06-28 — End: 2015-06-30
  Filled 2015-06-28 (×3): qty 1000

## 2015-06-28 MED ORDER — PANTOPRAZOLE SODIUM 40 MG IV SOLR
40.0000 mg | Freq: Every day | INTRAVENOUS | Status: DC
Start: 2015-06-28 — End: 2015-06-30
  Administered 2015-06-28 – 2015-06-30 (×3): 40 mg via INTRAVENOUS
  Filled 2015-06-28 (×3): qty 40

## 2015-06-28 MED ORDER — ONDANSETRON HCL 4 MG/2ML IJ SOLN
4.0000 mg | Freq: Four times a day (QID) | INTRAMUSCULAR | Status: AC | PRN
Start: 2015-06-28 — End: 2015-06-28
  Administered 2015-06-28: 4 mg via INTRAVENOUS
  Filled 2015-06-28: qty 2

## 2015-06-28 MED ORDER — INFLUENZA VAC SPLIT QUAD 0.5 ML IM SUSY
0.5000 mL | PREFILLED_SYRINGE | Freq: Once | INTRAMUSCULAR | Status: DC
Start: 2015-06-28 — End: 2015-06-28

## 2015-06-28 MED ORDER — POLYETHYLENE GLYCOL 3350 17 G PO PACK
17.0000 g | PACK | Freq: Every day | ORAL | Status: DC
Start: 2015-06-28 — End: 2015-06-30
  Administered 2015-06-29 – 2015-06-30 (×2): 17 g via ORAL
  Filled 2015-06-28 (×3): qty 1

## 2015-06-28 MED ORDER — DEXTROSE-SODIUM CHLORIDE 5-0.9 % IV SOLN
INTRAVENOUS | Status: DC
Start: 2015-06-28 — End: 2015-06-30

## 2015-06-28 MED ORDER — FAMOTIDINE 10 MG/ML IV SOLN (WRAP)
20.0000 mg | Freq: Two times a day (BID) | INTRAVENOUS | Status: DC
Start: 2015-06-28 — End: 2015-06-30
  Administered 2015-06-28 – 2015-06-30 (×6): 20 mg via INTRAVENOUS
  Filled 2015-06-28 (×6): qty 2

## 2015-06-28 MED ORDER — LORAZEPAM 2 MG/ML IJ SOLN
2.0000 mg | INTRAMUSCULAR | Status: DC | PRN
Start: 2015-06-28 — End: 2015-06-30
  Administered 2015-06-28 – 2015-06-30 (×3): 2 mg via INTRAVENOUS
  Filled 2015-06-28 (×3): qty 1

## 2015-06-28 MED ORDER — MORPHINE SULFATE 2 MG/ML IJ/IV SOLN (WRAP)
2.0000 mg | Status: DC | PRN
Start: 2015-06-28 — End: 2015-06-30

## 2015-06-28 NOTE — UM Notes (Signed)
06/27/15 2225  Admit to Inpatient   (Order [id number])           ATTENDING H&P 06/27/15    Chief Complaint   Patient presents with   . Abdominal Pain   . Alcohol Intoxication           44 y.o. male with a PMHx of EtOH abuse and cirrhosis of liver who presented with the complaint of abdominal pain, mostly epigastric, but about a month.  History of drinking liquor every day. This pain gets worse when the drinking is stopped. On the average patient takes 10 beers a day and a variable amount of liquor every day. Today he presents with nausea, vomiting, lightheadedness, confusion and increasing tremors.  Patient has an established diagnosis of alcoholic cirrhosis with ascites.      Alcohol Use: 138.0 oz/week     210 Cans of beer, 20 Shots of liquor per week            AST (SGOT) 139 (H) 5 - 34 U/L    ALT 94 (H) 0 - 55 U/L    Alkaline Phosphatase 100 38 - 106 U/L    Bilirubin, Total 2.2 (H) 0.2 - 1.2 mg/dL    Globulin 4.1 (H) 2.0 - 3.6 g/dL    Albumin/Globulin Ratio 1.0 0.9 - 2.2    Anion Gap 16.0 (H) 5.0 - 15.0               Bilirubin, Direct 0.9 (H)                 Alcohol 308 (H)              Alcohol intoxication, uncomplicated (06/28/2015)   Assessment: Alcohol intoxication, patient to apply ice to continue to drink.    Plan: , we will manage for withdrawal symptoms. ..initiate banana bag and lorazepam IV, will watch for spontaneous bacterial peritonitis.   HYPERBILIRUBINEMIA - HYDRATE PT  ALCOHOLIC CIRRHOSIS OF LIVER W/ ASCITES - MONITOR LIVER FUNCTION  ALCOHOL WITHDRAWAL  - MONITOR FOR ARRHTHMIA /S/S OF WITHDRAWAL.Marland KitchenTAPERING DOSE OF IV ATIVAN PRN      ED    Medication Administration from 06/27/2015 1831 to 06/27/2015 2322         Date/Time Order Dose Route Action Action by Comments      06/27/2015 2231 sodium chloride 0.9 % bolus 2,000 mL 0 mL Intravenous Stopped Gianan, Irena Reichmann, RN completed.      06/27/2015 1951 sodium chloride 0.9 % bolus 2,000 mL 2,000  mL Intravenous [address] Elpidio Anis, RN       06/27/2015 2153 sodium chloride 0.9 % 1,000 mL with thiamine 100 mg, folic acid 1 mg, M.V.I. ADULT 10 mL infusion 0 mL/hr Intravenous Stopped Gianan, Ana Rogelia Boga, RN completed.      06/27/2015 1950 sodium chloride 0.9 % 1,000 mL with thiamine 100 mg, folic acid 1 mg, M.V.I. ADULT 10 mL infusion 1,000 mL/hr Intravenous [address] Elpidio Anis, RN       06/27/2015 2238 sodium chloride 0.9 % bolus 1,000 mL 1,000 mL Intravenous [address] Elpidio Anis, RN       06/27/2015 2238 diazepam (VALIUM) injection 5 mg 5 mg Intravenous Given Gianan, Irena Reichmann, RN

## 2015-06-28 NOTE — Plan of Care (Signed)
Problem: Safe medical management of withdrawal from (list substances of abuse) AS EVIDENCED BY...  Goal: Compliance with management of withdrawal syndrome  Intervention: Assess withdrawal signs/symptoms according to identified protocol e.g. CIWA/COWS  The patient and care giver's learning abilities have been assessed. Patient is dx. With Alcohol Withdrawal. Patient speaks spanish and son is bedside to interpret. Today's individualized plan of care to monitor seizure precaution, prevent falls, pain management, and administer ordered medication was discussed with the patient and care giver and agree to it. Patient and care giver demonstrates understanding of disease process, treatment plan, medications and consequences of noncompliance. All questions and concerns were addressed.

## 2015-06-28 NOTE — Consults (Signed)
CONSULTATION NOTE    [address], [address] [place], [place] [address]  [phone]  Austin Reilly [id number]    Date of admission: 06/27/2015  Date of consult: 06/28/2015    GI Attending Note  Patient seen and evaluated. He minimizes the hematemesis and at least in review of his labs, his H/H is stable. Will continue to monitor for now. As for his bowel movements, he has apparently not had a solid bowel movement in a month. He has passed some liquid. His nieces attribute this to the fact that he doesn't eat and instead only drinks alcohol. Will perform a KUB to exclude fecal impaction.    [name], MD    Assessment:  Alcohol intoxication - drinks 12-48oz of beer daily  Epigastric pain - suspect alcoholic gastritis  ?Hematmesis - per niece, noted for BRB emesis prior to admission. H/H stable. Will monitor.   Liver disease - ?alcoholic liver cirrhosis vs NASH  Constipation    Austin Reilly is a 44 y.o. male with PMHx alcohol abuse, presented to the ER for alcohol intoxiciaton with c/o abdominal pain, nausea, and vomiting. Per niece, noted for hematemesis as well. ?gastritis vs MWT. H/H stable.   ___________________________  Plan:  1) Start PPI daily  2) Monitor H/H and s/sx of GIB  3) CIWA protocol  4) Discussed cessation of ETOH  5) Monitor daily labs, including LFTs and INR  6) Monitor electrolytes, replete PRN per primary  7) F/u pending [place] abd  8) Start Miralax for bowel regimen  9) Continue supportive care  10) Will d/w Dr. Lajuana Matte Weyman Pedro, NP   12:22 PM    Thank you for allowing [place] to see and participate in this patient's care.  This case will be discussed with Dr. Maple Hudson who will see this patient as well today and will write an accompanying GI consultation and treatment plan.  ________________________    Referring Physician: Dr. Ambrose Pancoast    Consulting Physician: Dr. Maple Hudson    Reason for consultation: abdominal pain    Chief complaint:     HPI:  Austin Reilly is a 44 y.o. male with history of alcohol  abuse, was brought into the ER by his niece for evaluation of alcohol intoxication. Pt limited historian. Pt spanish-speaking only. Niece currently at bedside who helps with translation and history. Per nieces, patient with long history of alcohol abuse, usually drinks 12- 48oz of beer daily for years, and is constantly drunk. Reports pt c/o intermittent burning epigastric pain and had multiple episodes of nausea and vomiting with some hematemesis prior to arriving in the ER. Unable to identify any aggravating or alleviating symptoms. Reports pt with h/o bleeding in the past before, requiring hospitalization, but doesn't know if patient every had EGD/Colonoscopy before. Reports patient with issues with constipation due to lack to nutritional intake. Denies any fever, chills, SOB, chest pain, changes in bowel habits, or unintentional weight loss. Denies any ASA/NSAID/anticoagulant use.     Past Medical History   Diagnosis Date   . Cirrhosis    . Alcoholism    . Hematuria        History reviewed. No pertinent past surgical history.    No Known Allergies    Social History     Social History   . Marital Status: Single     Spouse Name: N/A   . Number of Children: N/A   . Years of Education: N/A     Occupational History   .  Not on file.     Social History Main Topics   . Smoking status: Never Smoker    . Smokeless tobacco: Not on file   . Alcohol Use: 138.0 oz/week     210 Cans of beer, 20 Shots of liquor per week   . Drug Use: Not on file   . Sexual Activity: Not on file     Other Topics Concern   . Not on file     Social History Narrative       History reviewed. No pertinent family history.    There are no discharge medications for this patient.      Current Facility-Administered Medications   Medication Dose Route Frequency Last Rate Last Dose   . ciprofloxacin (CIPRO) tablet 500 mg  500 mg Oral Q24H   500 mg at 06/28/15 1006   . dextrose  5 % and 0.45 % NaCl infusion  100 mL/hr Intravenous Continuous 100 mL/hr at  06/28/15 0118 100 mL/hr at 06/28/15 0118   . dextrose  5 % and 0.9 % NaCl infusion   Intravenous Continuous       . dextrose 5 % and 0.9% NaCl 1,000 mL with thiamine 100 mg, folic acid 1 mg, M.V.I. ADULT 10 mL infusion   Intravenous Q24H       . famotidine (PEPCID) injection 20 mg  20 mg Intravenous Q12H SCH   20 mg at 06/28/15 1006   . LORazepam (ATIVAN) injection 2-4 mg  2-4 mg Intravenous Q1H PRN   2 mg at 06/28/15 1128   . morphine injection 2 mg  2 mg Intravenous Q4H PRN           Review of Systems  Constitutional  Negative for fevers or weight loss   Skin  Negative for rash   HENT  Negative for sore throat   Eyes  Negative for blurred vision   Cardiovascular  Negative for chest pain   Respiratory  Negative for SOB or cough   Gastrointestinal  SEE HPI   Genitourinary  Negative for dysuria, hematuria, or frequency   Musculoskeletal  Negative for joint pain   Endo  Negative for diabetes   Heme  Negative for anemia   Neurological  Negative for syncope   Psych  Negative for depression       Physical Exam  BP 148/87 mmHg  Pulse 93  Temp(Src) 98.6 F (37 C) (Oral)  Resp 18  Ht 1.575 m (5\' 2" )  Wt 64 kg (141 lb 1.5 oz)  BMI 25.80 kg/m2  SpO2 96%    General Appearance:    no acute distress, appears stated age and looks comfortable   HEENT:    Normocephalic, without obvious abnormality, atraumatic, PERRL, sclera anicteric, oral mucosa pink/moist   Lungs:     Clear to auscultation bilaterally, no wheezing/rhonchi/rales    Heart:    Regular rate and rhythm, S1 and S2 normal, no murmur, rub or gallops appreciated   Abdomen:    soft, mild epigastric TTP, no rebound/guarding, +BS   Rectal:   deferred    Extremities:   Extremities normal, atraumatic, no cyanosis or edema   Skin:   Skin color, texture, turgor normal, no rashes or lesions and no jaundice   Neurologic:   AAOx3, no focal deficits           Psychological: normal affect    Laboratory Data reviewed:      Recent Labs  Lab 06/28/15  0340 06/27/15  1934  WBC  5.14 5.78   HGB 14.4 16.1   HEMATOCRIT 43.4 46.6   PLATELETS 158 176   MCV 91.0 89.6   NEUTROPHILS  --  62   SEGMENTED NEUTROPHILS 66  --        Recent Labs  Lab 06/28/15  1016 06/28/15  0340 06/27/15  1934   SODIUM  --   --  143   POTASSIUM  --   --  3.7   CHLORIDE  --   --  105   CO2  --   --  22   BUN  --   --  6.0*   CREATININE  --   --  0.8   GLUCOSE  --   --  96   CALCIUM  --   --  9.2   MAGNESIUM 1.8 1.8  --    PROTEIN, TOTAL  --   --  8.1   ALBUMIN  --   --  4.0   AST (SGOT)  --   --  139*   ALT  --   --  94*   ALKALINE PHOSPHATASE  --   --  100   BILIRUBIN, TOTAL  --   --  2.2*     Glucose:    Recent Labs  Lab 06/27/15  1934   GLUCOSE 96       Recent Labs  Lab 06/28/15  0340   PT 13.6   PT INR 1.0       Radiological Imaging reviewed:  None

## 2015-06-28 NOTE — Plan of Care (Signed)
Problem: Safety  Goal: Patient will be free from injury during hospitalization  The patient and care giver's learning abilities have been assessed. Today's individualized plan of care to monitor vs, monitor for seizure, monitor CIWA, monitor for agitation and BP was discussed with the patient and care giver and agreed to. Patient and care giver demonstrates understanding of disease process, treatment plan, medications and consequences of noncompliance. All questions and concerns were addressed.       Fall precaution maintained. Re instructed to call for assistance, wait for help to arrive before getting out of bed. Pt educated about the importance of preventing falls and about proper safety.  Pt demonstrated understanding of education by repeating back.  Pt's informed of POC/goals. Verbalized understanding. Bed in lowest position. Bed wheels locked. Bed alarm on. Non-skid socks on bilaterally. Floor mats in used. Call light, phone, over bed table and personal belongings in reach. Participate in Hourly rounding. White board updated.

## 2015-06-28 NOTE — H&P (Signed)
[facility]      Patient: Austin Reilly  Date: 06/27/2015   DOB: 1970-12-02  Admission Date: 06/27/2015   MRN: [id number]  Attending: Adella Nissen         Chief Complaint   Patient presents with   . Abdominal Pain   . Alcohol Intoxication      History Gathered From: From the patient and ED chart with the help of the patient's son translated from [place].    HISTORY AND PHYSICAL     Austin Reilly is a 44 y.o. male with a PMHx of EtOH abuse and cirrhosis of liver who presented with the complaint of abdominal pain, mostly epigastric, but about a month.  History of drinking liquor every day.  This pain gets worse when the drinking is stopped.  On the average patient takes 10 beers a day and a variable amount of liquor every day.  Today he presents with nausea, vomiting, lightheadedness, confusion and increasing tremors.  Denies any cough, hemoptysis, hematemesis or melenic.  No history of fever, rigor, chills.  Patient has an established diagnosis of alcoholic cirrhosis with ascites.  History of good appetite but does not eat when he drinks.  History of constipation.    Past Medical History   Diagnosis Date   . Cirrhosis    . Alcoholism    . Hematuria        History reviewed. No pertinent past surgical history.    Prior to Admission medications    Not on File       No Known Allergies    CODE STATUS: Full code    PRIMARY CARE MD: Pcp, Noneorunknown, MD    History reviewed. No pertinent family history.    Social History   Substance Use Topics   . Smoking status: Never Smoker    . Smokeless tobacco: None   . Alcohol Use: 138.0 oz/week     210 Cans of beer, 20 Shots of liquor per week       REVIEW OF SYSTEMS   Positive for: Abdominal pain, abdominal distention, ascites, history of cirrhosis of liver.  Negative for: Chest pain, cough, hemoptysis, hematemesis, Melena.  All review of systems completed.    PHYSICAL EXAM     Vital Signs (most recent): BP 144/89 mmHg  Pulse 103  Temp(Src) 98.8 F (37.1 C) (Oral)  Resp 19   Ht 1.575 m (5\' 2" )  Wt 63.504 kg (140 lb)  BMI 25.60 kg/m2  SpO2 96%  Constitutional: poorly groomed. Patient speaks freely in full sentences.   HEENT: NC/AT, PERRL, no scleral icterus or conjunctival pallor, no nasal discharge, MMM, oropharynx without erythema or exudate  Neck: trachea midline, supple, no cervical or supraclavicular lymphadenopathy or masses  Cardiovascular: RRR, normal S1 S2, no murmurs, gallops, palpable thrills, no JVD, Non-displaced PMI.  Respiratory: Normal rate. No retractions or increased work of breathing. Clear to auscultation and percussion bilaterally.  Gastrointestinal: +BS, non-distended, soft, non-tender, no rebound or guarding, no hepatosplenomegaly, fluid thrill and shifting dullness present.  Genitourinary: no suprapubic or costovertebral angle tenderness  Musculoskeletal: ROM and motor strength grossly normal. No clubbing, edema, or cyanosis. DP and radial pulses 2+ and symmetric.  Skin exam:  Warm, dry and intact.  Neurologic: EOMI, CN 2-12 grossly intact. no gross motor or sensory deficits  Psychiatric: AAOx3, affect and mood appropriate. The patient is alert, interactive, appropriate.  Capillary refill:  Normal capillary refill.    Exam done by Adella Nissen, MD on 06/27/2015 at  11.00 AM.      LABS & IMAGING     Recent Results (from the past 24 hour(s))   CBC and differential    Collection Time: 06/27/15  7:34 PM   Result Value Ref Range    WBC 5.78 3.50 - 10.80 x10 3/uL    Hgb 16.1 13.0 - 17.0 g/dL    Hematocrit 16.1 09.6 - 52.0 %    Platelets 176 140 - 400 x10 3/uL    RBC 5.20 4.70 - 6.00 x10 6/uL    MCV 89.6 80.0 - 100.0 fL    MCH 31.0 28.0 - 32.0 pg    MCHC 34.5 32.0 - 36.0 g/dL    RDW 14 12 - 15 %    MPV 10.4 9.4 - 12.3 fL    Neutrophils 62 None %    Lymphocytes Automated 26 None %    Monocytes 10 None %    Eosinophils Automated 1 None %    Basophils Automated 1 None %    Immature Granulocyte 0 None %    Nucleated RBC 0 0 - 1 /100 WBC    Neutrophils Absolute 3.60  1.80 - 8.10 x10 3/uL    Abs Lymph Automated 1.51 0.50 - 4.40 x10 3/uL    Abs Mono Automated 0.56 0.00 - 1.20 x10 3/uL    Abs Eos Automated 0.05 0.00 - 0.70 x10 3/uL    Absolute Baso Automated 0.06 0.00 - 0.20 x10 3/uL    Absolute Immature Granulocyte 0.01 0 x10 3/uL   Comprehensive Metabolic Panel (CMP)    Collection Time: 06/27/15  7:34 PM   Result Value Ref Range    Glucose 96 70 - 100 mg/dL    BUN 6.0 (L) 9.0 - 04.5 mg/dL    Creatinine 0.8 0.7 - 1.3 mg/dL    Sodium 409 811 - 914 mEq/L    Potassium 3.7 3.5 - 5.1 mEq/L    Chloride 105 100 - 111 mEq/L    CO2 22 22 - 29 mEq/L    Calcium 9.2 8.5 - 10.5 mg/dL    Protein, Total 8.1 6.0 - 8.3 g/dL    Albumin 4.0 3.5 - 5.0 g/dL    AST (SGOT) 782 (H) 5 - 34 U/L    ALT 94 (H) 0 - 55 U/L    Alkaline Phosphatase 100 38 - 106 U/L    Bilirubin, Total 2.2 (H) 0.2 - 1.2 mg/dL    Globulin 4.1 (H) 2.0 - 3.6 g/dL    Albumin/Globulin Ratio 1.0 0.9 - 2.2    Anion Gap 16.0 (H) 5.0 - 15.0   Lipase    Collection Time: 06/27/15  7:34 PM   Result Value Ref Range    Lipase 58 8 - 78 U/L   B-type Natriuretic Peptide (BNP)    Collection Time: 06/27/15  7:34 PM   Result Value Ref Range    B-Natriuretic Peptide <10 0 - 100 pg/mL   Lactate Dehydrogenase (LDH)    Collection Time: 06/27/15  7:34 PM   Result Value Ref Range    LDH 301 (H) 125 - 220 U/L   Hemolysis index    Collection Time: 06/27/15  7:34 PM   Result Value Ref Range    Hemolysis Index 6 0 - 18   GFR    Collection Time: 06/27/15  7:34 PM   Result Value Ref Range    EGFR >60.0    Alcohol (Ethanol) Level    Collection Time: 06/27/15  7:34 PM  Result Value Ref Range    Alcohol 308 (H) None Detected mg/dL   Bilirubin, direct    Collection Time: 06/27/15  7:34 PM   Result Value Ref Range    Bilirubin, Direct 0.9 (H) 0.0 - 0.5 mg/dL   UA, Reflex to Microscopic (pts 3 + yrs)    Collection Time: 06/27/15  7:54 PM   Result Value Ref Range    Urine Type Clean Catch     Color, UA Straw Clear - Yellow    Clarity, UA Clear Clear - Hazy     Specific Gravity UA 1.003 1.001-1.035    Urine pH 7.0 5.0-8.0    Leukocyte Esterase, UA Negative Negative    Nitrite, UA Negative Negative    Protein, UR 30 (A) Negative    Glucose, UA Negative Negative    Ketones UA Negative Negative    Urobilinogen, UA Negative 0.2  -  2.0 mg/dL    Bilirubin, UA Negative Negative    Blood, UA Negative Negative    RBC, UA 0 - 5 0 - 5 /hpf    WBC, UA 0 - 5 0 - 5 /hpf       MICROBIOLOGY:  Blood Culture: Not indicated.  Urine Culture: not indicated.  Antibiotics Started: not indicated.    IMAGING:  Upon my review: review.    CARDIAC:  EKG Interpretation (upon my review):  .  108 bpm, sinus tachycardia, no ST changes.    Markers:    not indicated.  In    EMERGENCY DEPARTMENT COURSE:  Orders Placed This Encounter   Procedures   . CBC and differential   . Comprehensive Metabolic Panel (CMP)   . Lipase   . B-type Natriuretic Peptide (BNP)   . UA, Reflex to Microscopic (pts 3 + yrs)   . Lactate Dehydrogenase (LDH)   . Hemolysis index   . GFR   . Alcohol (Ethanol) Level   . Bilirubin, direct   . CBC WITH MANUAL DIFFERENTIAL   . Magnesium   . Prothrombin time/INR   . Hemolysis index   . Diet regular   . CIWA Scale   . ED Holding Orders Expire in 24 Hours   . Notify Admitting Attending ( Change in Condition)   . Notify Attending of Patient Arrival to Floor within 24 Hours   . Notify Physician (Vital Signs)   . Notify Physician (Lab Results)   . Vital Signs Q4HR   . Bed Rest   . Telemetry 24 Hour Protocol   . ED Unit Sec Comm Order   . Admit to Inpatient   . [facility] ED Bed Request   . Seizure precautions   . Fall Precautions   . Seizure Precautions       ASSESSMENT & PLAN     Austin Reilly is a 44 y.o. male admitted under my care with Alcohol intoxication, uncomplicated.    Patient Active Hospital Problem List:     Alcohol intoxication, uncomplicated (06/28/2015)    Assessment: Alcohol intoxication, patient to apply ice to continue to drink.      Plan: , we will manage for withdrawal  symptoms.  Patient education done and encouraged to drinking.  We will initiate banana bag and lorazepam IV, will watch for spontaneous bacterial peritonitis.       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  Plan: We will prescribe famotidine 20 mg IV twice a day.     Hyperbilirubinemia (06/28/2015)  Assessment: mild, secondary to cirrhosis of liver.      Plan: .  We will observe and encourage the patient quit drinking and hydrate the patient.       Alcoholic cirrhosis of liver with ascites (06/28/2015)    Assessment: Secondary to the chronic cirrhosis resulting from alcoholic hepatitis.    Plan: We will monitor liver function tests and continue stressing the patient quit drinking.       Alcohol withdrawal (06/27/2015)    Assessment: We will continuously monitor for any arrhythmia or observational monitored 24, by 7 symptoms of withdrawal.      Plan: .  We will prescribe tapering dose of Ativan if needed.          Nutrition  Regular diet.      DVT/VTE Prophylaxis  Bilateral SCDs/enoxaparin 40 mg subcutaneous daily     Anticipated medical stability for discharge: 2-3 days.    Service status/Reason for ongoing hospitalization: possibility of the alcohol withdrawal symptoms, severity of which is unpredictable.  Anticipated Discharge Need: None    Signed,  Adella Nissen    06/28/2015 2:48 AM  Time Elapsed: 90 minutes.

## 2015-06-28 NOTE — Progress Notes (Signed)
This patient was recently admitted early this morning by my colleague, Dr. Rolan Lipa.  I have independently seen and examined this patient.  Patient is currently doing OK lying comfortably on hospital bed.    1.  Alcohol intoxication-drinks approximately 10 beers per day.  Continue with CIWA.  Reiterated the importance of stopping ETOH.  Continue with telemetry monitoring      2.  Epigastric pain-suspect alcohol gastroenteritis.  Patient will be started on PPI daily.  As per gastroenterology.    3.  Alcohol and cirrhosis of liver with ascites-we will follow up with gastrointestinal recommendation.  Continue following with daily labs, along with LFTs and INR.  We will follow up with ultrasound of abdomen.

## 2015-06-28 NOTE — Progress Notes (Signed)
Rounded with GI doctor on patient this evening.  MD explained the plan of care to patient and family member.  Will continue to monitor patient for status changes.

## 2015-06-29 ENCOUNTER — Inpatient Hospital Stay: Payer: Charity

## 2015-06-29 LAB — BILIRUBIN, DIRECT: Bilirubin Direct: 1.2 mg/dL — ABNORMAL HIGH (ref 0.0–0.5)

## 2015-06-29 LAB — ECG 12-LEAD
Atrial Rate: 123 {beats}/min
Atrial Rate: 91 {beats}/min
P Axis: 24 degrees
P Axis: 54 degrees
P-R Interval: 152 ms
P-R Interval: 140 ms
Q-T Interval: 294 ms
Q-T Interval: 374 ms
QRS Duration: 94 ms
QRS Duration: 100 ms
QTC Calculation (Bezet): 420 ms
QTC Calculation (Bezet): 460 ms
R Axis: 21 degrees
R Axis: 18 degrees
T Axis: 12 degrees
T Axis: 30 degrees
Ventricular Rate: 123 {beats}/min
Ventricular Rate: 91 {beats}/min

## 2015-06-29 LAB — COMPREHENSIVE METABOLIC PANEL
ALT: 113 U/L — ABNORMAL HIGH (ref 0–55)
AST (SGOT): 151 U/L — ABNORMAL HIGH (ref 5–34)
Albumin/Globulin Ratio: 0.9 (ref 0.9–2.2)
Albumin: 3.9 g/dL (ref 3.5–5.0)
Alkaline Phosphatase: 87 U/L (ref 38–106)
Anion Gap: 10 (ref 5.0–15.0)
BUN: 5 mg/dL — ABNORMAL LOW (ref 9.0–28.0)
Bilirubin, Total: 3.6 mg/dL — ABNORMAL HIGH (ref 0.2–1.2)
CO2: 23 mEq/L (ref 22–29)
Calcium: 9.7 mg/dL (ref 8.5–10.5)
Chloride: 104 mEq/L (ref 100–111)
Creatinine: 0.8 mg/dL (ref 0.7–1.3)
Globulin: 4.2 g/dL — ABNORMAL HIGH (ref 2.0–3.6)
Glucose: 100 mg/dL (ref 70–100)
Potassium: 4 mEq/L (ref 3.5–5.1)
Protein, Total: 8.1 g/dL (ref 6.0–8.3)
Sodium: 137 mEq/L (ref 136–145)

## 2015-06-29 LAB — HEMOLYSIS INDEX: Hemolysis Index: 25 — ABNORMAL HIGH (ref 0–18)

## 2015-06-29 LAB — CBC
Hematocrit: 48.6 % (ref 42.0–52.0)
Hgb: 16.8 g/dL (ref 13.0–17.0)
MCH: 31.6 pg (ref 28.0–32.0)
MCHC: 34.6 g/dL (ref 32.0–36.0)
MCV: 91.5 fL (ref 80.0–100.0)
MPV: 11.6 fL (ref 9.4–12.3)
Nucleated RBC: 0 /100 WBC (ref 0–1)
Platelets: 128 10*3/uL — ABNORMAL LOW (ref 140–400)
RBC: 5.31 10*6/uL (ref 4.70–6.00)
RDW: 14 % (ref 12–15)
WBC: 7.29 10*3/uL (ref 3.50–10.80)

## 2015-06-29 LAB — GFR: EGFR: >60

## 2015-06-29 NOTE — Plan of Care (Signed)
Problem: Safe medical management of withdrawal from (list substances of abuse) AS EVIDENCED BY...  Goal: Safe detoxification/education about relapse prevention/engagement in discharge planning  The patient and care giver's learning abilities have been assessed. Today's individualized plan of care to monitor withdrawal sypmtoms and iv hydration...Marland KitchenMarland KitchenMarland Kitchenwas discussed with the patient and care giver and agree to it. Patient and care giver demonstrates understanding of disease process, treatment plan, medications and consequences of noncompliance. All questions and concerns were addressed.

## 2015-06-29 NOTE — Progress Notes (Signed)
[facility]  PROGRESS NOTE      Patient: Austin Reilly  Date: 06/29/2015   LOS: 2 Days  Admission Date: 06/27/2015   MRN: [id number]  Attending: Dorian Heckle  Please contact me on the following Spectralink [phone]       ASSESSMENT/PLAN     Austin Reilly is a 44 y.o. male admitted with Alcohol intoxication, uncomplicated    Interval Summary:     Patient Active Hospital Problem List:   Alcohol intoxication, uncomplicated (06/28/2015)       Alcohol withdrawal (06/27/2015)    Assessment: drinks approximately 10 beers per day.  Last drink yesterday morning.  Patient getting sinus tachycardia up to heart rate of 140s with movements.  Patient is currently within timeframe of alcohol withdrawal     Plan: Continue with CIWA protocol.     Alcoholic cirrhosis of liver with ascites (06/28/2015)    Assessment: Ultrasound of abdomen showing cirrhosis.  Not much ascites noted.  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                          Analgesia: Morphine    Nutrition: Regular    DVT Prophylaxis: SCD       Code Status: Full    DISPO: Possible discharge to home tomorrow           SUBJECTIVE     Austin Reilly states he feels little better today.  Reports improvement in his abdominal pain.  He does complain of little bit of tremor in his hands and chest palpitations    MEDICATIONS     Current Facility-Administered Medications   Medication Dose Route Frequency   . ciprofloxacin  500 mg Oral Q24H   . IV fluids with MVI, thiamine (VITAMIN B-1), folic acid   Intravenous Q24H   . famotidine  20 mg Intravenous Q12H SCH   . pantoprazole  40 mg Intravenous Daily   . polyethylene glycol  17 g Oral Daily       PHYSICAL EXAM     Filed Vitals:    06/29/15 1500   BP: 135/85   Pulse: 87   Temp: 98.4 F (36.9 C)   Resp: 19   SpO2: 98%       Temperature: Temp  Min: 97 F (36.1  C)  Max: 98.7 F (37.1 C)  Pulse: Pulse  Min: 83  Max: 99  Respiratory: Resp  Min: 19  Max: 19  Non-Invasive BP: BP  Min: 118/83  Max: 147/95  Pulse Oximetry SpO2  Min: 95 %  Max: 98 %    Intake and Output Summary (Last 24 hours) at Date Time    Intake/Output Summary (Last 24 hours) at 06/29/15 1856  Last data filed at 06/29/15 1700   Gross per 24 hour   Intake   1725 ml   Output    600 ml   Net   1125 ml         GEN APPEARANCE: Normal;  A&OX3  HEENT: PERLA; EOMI; Conjunctiva Clear  NECK: Supple; No bruits  CVS: RRR, S1, S2; No M/G/R  LUNGS: CTAB; No Wheezes; No Rhonchi: No rales  ABD: Soft; No TTP; + Normoactive BS  EXT: No edema; Pulses 2+ and intact  Skin exam:  Normal  not mottled or palor  NEURO: CN 2-12 intact; No Focal neurological deficits.  There are some mild resting tremors on bilateral hands  CAP REFILL:  Normal  MENTAL STATUS:  Normal          LABS       Recent Labs  Lab 06/29/15  1433 06/28/15  0340 06/27/15  1934   WBC 7.29 5.14 5.78   RBC 5.31 4.77 5.20   HGB 16.8 14.4 16.1   HEMATOCRIT 48.6 43.4 46.6   MCV 91.5 91.0 89.6   PLATELETS 128* 158 176         Recent Labs  Lab 06/29/15  1433 06/28/15  1016 06/28/15  0340 06/27/15  1934   SODIUM 137  --   --  143   POTASSIUM 4.0  --   --  3.7   CHLORIDE 104  --   --  105   CO2 23  --   --  22   BUN 5.0*  --   --  6.0*   CREATININE 0.8  --   --  0.8   GLUCOSE 100  --   --  96   CALCIUM 9.7  --   --  9.2   MAGNESIUM  --  1.8 1.8  --          Recent Labs  Lab 06/29/15  1433 06/27/15  1934   ALT 113* 94*   AST (SGOT) 151* 139*   BILIRUBIN, TOTAL 3.6* 2.2*   BILIRUBIN, DIRECT 1.2* 0.9*   ALBUMIN 3.9 4.0   ALKALINE PHOSPHATASE 87 100               Recent Labs  Lab 06/28/15  0340   PT INR 1.0   PT 13.6       Microbiology Results     None           RADIOLOGY     Reviewed    Lowanda Foster  6:56 PM 06/29/2015

## 2015-06-29 NOTE — Plan of Care (Signed)
Problem: Safe medical management of withdrawal from (list substances of abuse) AS EVIDENCED BY...  Goal: Safe detoxification/education about relapse prevention/engagement in discharge planning  Outcome: Progressing    The patient and care giver's learning abilities have been assessed. Patient is dx. With Alcohol Withdrawal. Patient speaks spanish and has phone at bedside. Today's individualized plan of care to monitor seizure precaution, prevent falls, pain management, NPO, Ultrasound and administer ordered medication was discussed with the patient and care giver and agree to it. Patient and care giver demonstrates understanding of disease process, treatment plan, medications and consequences of noncompliance. All questions and concerns were addressed.

## 2015-06-29 NOTE — Plan of Care (Signed)
Pt heart rate up to 140 with activity, Dr,Beak made aware, no new orders given.

## 2015-06-29 NOTE — Progress Notes (Signed)
Progress Note  SpectraLink [id number]    06/29/2015      Assessment:  Alcohol intoxication - drinks 12-48oz of beer daily  Epigastric pain - suspect alcoholic gastritis  ?Hematmesis - per niece, noted for BRB emesis prior to admission. H/H stable. Will monitor.   Liver disease - ?alcoholic liver cirrhosis vs NASH cirrhosis  Constipation  Gallbladder sludge    Austin Reilly is a 44 y.o. male with PMHx alcohol abuse, admitted for alcohol intoxiciaton with c/o abdominal pain, nausea, and vomiting. Per niece, noted for hematemesis as well PTA. ?gastritis vs MWT. H/H stable/improved. No further nausea/vomiting or hematemesis noted since admission  Sonogram with findings consistent with cirrhosis. MELD of 11 (using INR from yesterday).    Plan:  Diet as tolerated.   Continue PPI  Monitor H/H and for s/s GIB  CIWA protocol  Needs cessation of EtOH use.   Monitor labs, including INR, LFTs, CBC.   Miralax daily for constipation.  Management of lytes per primary  Supportive care.     This case will be discussed with Dr. Cam Hai.  Toshio Slusher Molinda Bailiff, PA   5:44 PM    Subjective:  Patient complains of some mild epigastric discomfort. Otherwise denies all other complaints.   Discussed with nursing -- no nausea/vomiting, hematemesis or signs of bleeding. No acute events.   No family at bedside.     Abdominal sonogram 06/29/2015: IMPRESSION: Findings likely represent cirrhotic liver were given history. No  discrete focal mass. No ascites. Small gallbladder sludge could represent bile stasis.  KUB from yesterday reviewed: INTERPRETATION: Frontal view of the abdomen was obtained.   There is a nonspecific bowel gas pattern. There are no gas-filled dilated loops of bowel. IMPRESSION:   Nonspecific bowel gas pattern.    Objective:    Current Facility-Administered Medications   Medication Dose Route Frequency Last Rate Last Dose   . ciprofloxacin (CIPRO) tablet 500 mg  500 mg Oral Q24H   500 mg at 06/29/15 0945   . dextrose  5  % and 0.9 % NaCl infusion   Intravenous Continuous       . dextrose 5 % and 0.9% NaCl 1,000 mL with thiamine 100 mg, folic acid 1 mg, M.V.I. ADULT 10 mL infusion   Intravenous Q24H 125 mL/hr at 06/28/15 2100     . famotidine (PEPCID) injection 20 mg  20 mg Intravenous Q12H SCH   20 mg at 06/29/15 0945   . LORazepam (ATIVAN) injection 2-4 mg  2-4 mg Intravenous Q1H PRN   2 mg at 06/28/15 1128   . morphine injection 2 mg  2 mg Intravenous Q4H PRN       . pantoprazole (PROTONIX) injection 40 mg  40 mg Intravenous Daily   40 mg at 06/29/15 0945   . polyethylene glycol (MIRALAX) packet 17 g  17 g Oral Daily   17 g at 06/29/15 0945       Physical Exam:  BP 135/85 mmHg  Pulse 87  Temp(Src) 98.4 F (36.9 C) (Oral)  Resp 19  Ht 1.575 m (5\' 2" )  Wt 77.656 kg (171 lb 3.2 oz)  BMI 31.31 kg/m2  SpO2 98%    General Appearance: NAD, comfortable, AAOx3  Abd: soft, mild epigastric ttp without rebound or guarding, ND, BS+      Recent Labs  Lab 06/29/15  1433 06/28/15  0340 06/27/15  1934   WBC 7.29 5.14 5.78   HGB 16.8 14.4 16.1   HEMATOCRIT 48.6 43.4 46.6  PLATELETS 128* 158 176   MCV 91.5 91.0 89.6   NEUTROPHILS  --   --  62   SEGMENTED NEUTROPHILS  --  66  --        Recent Labs  Lab 06/29/15  1433 06/28/15  1016 06/28/15  0340 06/27/15  1934   SODIUM 137  --   --  143   POTASSIUM 4.0  --   --  3.7   CHLORIDE 104  --   --  105   CO2 23  --   --  22   BUN 5.0*  --   --  6.0*   CREATININE 0.8  --   --  0.8   GLUCOSE 100  --   --  96   CALCIUM 9.7  --   --  9.2   MAGNESIUM  --  1.8 1.8  --    PROTEIN, TOTAL 8.1  --   --  8.1   ALBUMIN 3.9  --   --  4.0   AST (SGOT) 151*  --   --  139*   ALT 113*  --   --  94*   ALKALINE PHOSPHATASE 87  --   --  100   BILIRUBIN, TOTAL 3.6*  --   --  2.2*     Glucose:    Recent Labs  Lab 06/29/15  1433 06/27/15  1934   GLUCOSE 100 96       Recent Labs  Lab 06/28/15  0340   PT 13.6   PT INR 1.0

## 2015-06-30 LAB — COMPREHENSIVE METABOLIC PANEL
ALT: 115 U/L — ABNORMAL HIGH (ref 0–55)
AST (SGOT): 143 U/L — ABNORMAL HIGH (ref 5–34)
Albumin/Globulin Ratio: 0.9 (ref 0.9–2.2)
Albumin: 3.3 g/dL — ABNORMAL LOW (ref 3.5–5.0)
Alkaline Phosphatase: 76 U/L (ref 38–106)
Anion Gap: 10 (ref 5.0–15.0)
BUN: 6 mg/dL — ABNORMAL LOW (ref 9.0–28.0)
Bilirubin, Total: 2.5 mg/dL — ABNORMAL HIGH (ref 0.2–1.2)
CO2: 22 mEq/L (ref 22–29)
Calcium: 9 mg/dL (ref 8.5–10.5)
Chloride: 106 mEq/L (ref 100–111)
Creatinine: 0.7 mg/dL (ref 0.7–1.3)
Globulin: 3.7 g/dL — ABNORMAL HIGH (ref 2.0–3.6)
Glucose: 111 mg/dL — ABNORMAL HIGH (ref 70–100)
Potassium: 3.1 mEq/L — ABNORMAL LOW (ref 3.5–5.1)
Protein, Total: 7 g/dL (ref 6.0–8.3)
Sodium: 138 mEq/L (ref 136–145)

## 2015-06-30 LAB — CBC
Hematocrit: 44.7 % (ref 42.0–52.0)
Hgb: 15.2 g/dL (ref 13.0–17.0)
MCH: 30.9 pg (ref 28.0–32.0)
MCHC: 34 g/dL (ref 32.0–36.0)
MCV: 90.9 fL (ref 80.0–100.0)
MPV: 11.5 fL (ref 9.4–12.3)
Nucleated RBC: 0 /100 WBC (ref 0–1)
Platelets: 125 10*3/uL — ABNORMAL LOW (ref 140–400)
RBC: 4.92 10*6/uL (ref 4.70–6.00)
RDW: 14 % (ref 12–15)
WBC: 5.57 10*3/uL (ref 3.50–10.80)

## 2015-06-30 LAB — GFR: EGFR: >60

## 2015-06-30 LAB — PT/INR
PT INR: 1 (ref 0.9–1.1)
PT: 13.5 s (ref 12.6–15.0)

## 2015-06-30 LAB — HEMOLYSIS INDEX: Hemolysis Index: 4 (ref 0–18)

## 2015-06-30 LAB — BILIRUBIN, DIRECT: Bilirubin Direct: 0.9 mg/dL — ABNORMAL HIGH (ref 0.0–0.5)

## 2015-06-30 MED ORDER — OMEPRAZOLE 10 MG PO CPDR
20.0000 mg | DELAYED_RELEASE_CAPSULE | Freq: Every day | ORAL | Status: AC
Start: 2015-06-30 — End: ?

## 2015-06-30 MED ORDER — POTASSIUM CHLORIDE CRYS ER 20 MEQ PO TBCR
40.0000 meq | EXTENDED_RELEASE_TABLET | Freq: Once | ORAL | Status: AC
Start: 2015-06-30 — End: 2015-06-30
  Administered 2015-06-30: 40 meq via ORAL
  Filled 2015-06-30: qty 2

## 2015-06-30 MED ORDER — CIPROFLOXACIN HCL 500 MG PO TABS
500.0000 mg | ORAL_TABLET | Freq: Every day | ORAL | Status: DC
Start: 2015-06-30 — End: 2015-06-30

## 2015-06-30 MED ORDER — ACETAMINOPHEN 325 MG PO TABS
650.0000 mg | ORAL_TABLET | Freq: Four times a day (QID) | ORAL | Status: DC | PRN
Start: 2015-06-30 — End: 2015-06-30
  Administered 2015-06-30: 650 mg via ORAL
  Filled 2015-06-30: qty 2

## 2015-06-30 NOTE — Discharge Summary (Signed)
Discharge Summary    Date:06/30/2015   Patient Name: Austin Reilly,Austin Reilly  Attending Physician: Gwyndolyn Kaufman, MD    Date of Admission:   06/27/2015    Date of Discharge:   06/30/2015    Admitting Diagnosis:   Alcohol intoxication  Alcoholic Hepatitis  alcoholic liver cirrhosis      Discharge Dx:     Principal Diagnosis (Diagnosis after study, that is chiefly responsible for admission to inpatient status): Alcohol intoxication, uncomplicated  Alcoholic Hepatitis  Alcoholic liver cirrhosis  Possible alcoholic gastritis with Mallory weiss tear    Treatment Team:   Treatment Team:   Attending Provider: Gwyndolyn Kaufman, MD     Procedures performed:   Radiology: all results from this admission  US Abdomen Complete    06/29/2015   Findings likely represent cirrhotic liver were given history. No discrete focal mass. No ascites. Small gallbladder sludge could represent bile stasis. Max Fickle, MD 06/29/2015 8:13 AM     Xr Abdomen Portable    06/28/2015   Nonspecific bowel gas pattern. Wyatt Portela, MD 06/28/2015 7:45 PM       Reason for Admission:   Per admission H&P by Dr. Gigi Gin Rosser is a 44 y.o. male with a PMHx of EtOH abuse and cirrhosis of liver who presented with the complaint of abdominal pain, mostly epigastric, but about a month.  History of drinking liquor every day. This pain gets worse when the drinking is stopped. On the average patient takes 10 beers a day and a variable amount of liquor every day. Today he presents with nausea, vomiting, lightheadedness, confusion and increasing tremors. Denies any cough, hemoptysis, hematemesis or melenic. No history of fever, rigor, chills.  Patient has an established diagnosis of alcoholic cirrhosis with ascites. History of good appetite but does not eat when he drinks. History of constipation.    Hospital Course:         Condition at Discharge:   Improved, stable, abmbulatory   Today:     BP 128/78 mmHg  Pulse 79  Temp(Src) 97.7 F (36.5 C) (Oral)  Resp  18  Ht 1.575 m (5\' 2" )  Wt 78.064 kg (172 lb 1.6 oz)  BMI 31.47 kg/m2  SpO2 96%  Ranges for the last 24 hours:  Temp:  [97.3 F (36.3 C)-98 F (36.7 C)] 97.7 F (36.5 C)  Heart Rate:  [79-96] 79  Resp Rate:  [17-18] 18  BP: (110-132)/(68-92) 128/78 mmHg    Last set of labs     Recent Labs  Lab 06/30/15  0340   WBC 5.57   HGB 15.2   HEMATOCRIT 44.7   PLATELETS 125*       Recent Labs  Lab 06/30/15  0340   SODIUM 138   POTASSIUM 3.1*   CHLORIDE 106   CO2 22   BUN 6.0*   CREATININE 0.7   EGFR >60.0   GLUCOSE 111*   CALCIUM 9.0       Recent Labs  Lab 06/30/15  0340   BILIRUBIN, TOTAL 2.5*   BILIRUBIN, DIRECT 0.9*   PROTEIN, TOTAL 7.0   ALBUMIN 3.3*   ALT 115*   AST (SGOT) 143*       Recent Labs  Lab 06/30/15  0340   PT 13.5   PT INR 1.0       Micro / Labs / Path pending:     Unresulted Labs     None  Discharge Instructions For Providers       Discharge Instructions:           Follow-up Information     Follow up with Pcp, Noneorunknown, MD.        Follow up with Bland Span, MD. Schedule an appointment as soon as possible for a visit in 1 week.    Specialty:  Gastroenterology    Contact information:    [address]  [place] [address]  [phone]            Discharge Diet: Low Saturated Fat / Low Cholesterol Diet       Disposition:  Home or Self Care     Discharge Medication List      Taking          omeprazole 10 MG capsule   Dose:  20 mg   Commonly known as:  PriLOSEC   Take 2 capsules (20 mg total) by mouth daily.           Minutes spent coordinating discharge and reviewing discharge plan: 30 minutes      Signed by: Vianne Bulls, MD

## 2015-06-30 NOTE — Plan of Care (Signed)
Patient is awake, A&OX3, disoriented to date.  Patient on CIWA protocol.  CIWA score 8, gave prn ativan.  Falls prevention and safety plan discussed with patient.  Falls precautions and seizure precautions in place.  Bed alarm activated, floor mat in place.  Offered assistance to BR.  Patient complaining of headache, gave prn ativan per CIWA protocol.  Plan continue to monitor, continue IVF, assist as needed, falls and seizure precautions.

## 2015-06-30 NOTE — Plan of Care (Signed)
Medication: Omeprazole(Prilosec)   This Medication is used for:  Reduce stomach acidity  Reflux disease(GERD)  Peptic ulcer disease    Common Side Effects are:  Abdominal pain  Headache  Diarrhea  Flatulence    A note from your nurse:  Call your nurse immediately if you notice itching, hives, swelling or trouble breathing

## 2015-06-30 NOTE — Discharge Instructions (Signed)
El Abuso Del Alcohol [Alcohol Abuse]  Tomar un exceso de bebidas alcohlicas es daino. Cuando la bebida llega a interferir en la vida de las personas, se habla de abuso del alcohol. El abuso del alcohol puede perjudicar sus relaciones con otras personas y Visual merchandiser perder amigos, su cnyuge o incluso su trabajo. Quizs usted est abusando del alcohol si cualquiera de las situaciones siguientes se aplica a su caso:   Descuida los deberes del hogar o el cuidado de nios a causa de la bebida.   Descuida los deberes del [place] o de la escuela a causa de la bebida.   Ha faltado al Aleen Campi o a la escuela a causa de la bebida.   Consume alcohol mientras maneja un vehculo o maquinaria pesada.   Tiene problemas con la ley (como detenciones) a causa de la bebida.   Sigue bebiendo a Scientist, water quality de que el alcohol le est causando serios problemas en la vida.  El abuso del alcohol puede causar problemas de [place]. Consumir alcohol en exceso puede provocar enfermedades del hgado o el pncreas, daar el cerebro y el corazn y elevar el riesgo de cncer; adems, puede ocasionar problemas sexuales y dificultades para concebir un nio. El abuso del alcohol durante el embarazo puede hacerle dao al beb. El alcohol diminuye las facultades mentales y la capacidad para responder: es ms probable morir en un accidente o en un incendio si se ha [employer].  Cuidados En [employer]   Reconozca que el alcohol se ha convertido en un problema para usted.   Pdale ayuda a su proveedor de atencin mdica as como a sus familiares de confianza o sus amigos ntimos.   Consiga ayuda de profesionales que estn capacitados para tratar a personas con abuso del alcohol. Por ejemplo, acuda a un consejero individual o a sesiones de psicoterapia en grupo; tambin puede inscribirse en un programa supervisado de tratamiento del abuso de alcohol.   Ingrese a un grupo de [place] para el abuso del alcohol, tal como Alcoholics Anonymous (AA).   Evite  a las personas que toman demasiado alcohol o le inducen a beber.  Haga una VISITA DE CONTROL segn le indique el mdico o el personal del centro. Comunquese con las siguientes agrupaciones para obtener ayuda:   Alcoholics Anonymous (AA): Vaya a SalaryStart.tn o busque en la gua telefnica para informarse sobre las reuniones cercanas a usted.   National Alcohol and Substance Abuse Information Center [facility]): 239-214-2964 www.addictioncareoptions.com   [employer] on Alcoholism and Drug Dependence (NCADD): 800-NCA-CALL (662)462-8450) www.ncadd.org   Al-Anon: 888-4AL-ANON (478-2956) www.al-anon.org  Obtenga Atencin Mdica Inmediata  si tiene:   Confusin   Alucinaciones (ve, oye o siente cosas que no existen)   Somnolencia extrema o imposibilidad para despertarse   Dolor cada vez mayor en la parte superior del abdomen   Vmito persistente o con sangre, o heces de color negruzco o alquitranado   Fuertes temblores o frecuencia cardaca acelerada   Convulsiones   2000-2015 [employer] [address], [place], [place] [address]. Todos los derechos reservados. Esta informacin no pretende sustituir la atencin Actor. Slo su mdico puede diagnosticar y tratar un problema de salud.          Adiccin: sus opciones de tratamiento    No hay un solo tratamiento de la adiccin que funcione para todo el [place]. El tratamiento que sea mejor para usted puede depender de muchos factores. Para [employer], el tratamiento puede ser una combinacin de [place],  cambios en el comportamiento y apoyo.  Medicamentos  Los medicamentos pueden ayudar con los sntomas de abstinencia. Pueden tambin reducir el deseo de consumir la sustancia que provoca la adiccin, ya que pueden anular sus efectos positivos. Por ejemplo:   La metadona, la buprenorfina y la naltrexona se usan para la adiccin a la herona y otros opioides.   El acamprosato, el disulfiram y la naltrexona se usan para tratar la  adiccin al alcohol.   El bupropin, la vareniclina o la terapia de sustitucin de la nicotina pueden ayudar con la adiccin a la nicotina.  Estos medicamentos han demostrado ser de bastante ayuda para la gente que est intentando superar una adiccin.  Tratamiento para el cambio de comportamiento   Entrevista motivacional ("MI", por sus siglas en ingls). Se trata de un tipo de consejera que le [place] a Multimedia programmer su comportamiento. El objetivo es explorar y Oncologist los sentimientos encontrados que pueda tener respecto de dejar de consumir drogas o alcohol. El terapeuta le ayudar a descubrir y a Insurance claims handler en sus razones personales para desear el cambio.    Terapia cognitiva conductual ("CBT", por sus siglas en ingls). En esta terapia, usted descubrir cules son sus problemas de [place] y aprender cmo Personal assistant. Por ejemplo, si el enojo o el estrs le producen deseos de beber, un terapeuta puede ayudarle a aprender maneras saludables de manejar esos sentimientos.   Enfoque basado en el refuerzo de la comunidad ("CRA", por sus siglas en ingls).Esta terapia emplea vouchers o vales para ayudarle a llevar un estilo de vida libre de drogas y de alcohol. Con cada muestra de orina limpia que tenga, le darn un voucher para que use a modo de recompensa. Esto le ayudar a Patent attorney del alcohol y las drogas mientras aprende nuevas habilidades para su vida diaria.   Refuerzo de la comunidad y capacitacin para la familia ("CRAFT", por sus siglas en ingls). Esta terapia se centra en aconsejar y capacitar a su familia. El terapeuta les ensear cmo motivarlo a usted para que busque o siga con Scientist, research (medical). Esta terapia tambin ayuda a que su familia reconozca situaciones familiares que quizs lo alienten a Product manager o a Artist.   Grupos de [place]. Estos grupos estn coordinados por voluntarios que no son profesionales de Radiographer, therapeutic. Su propsito es brindarse apoyo emocional y social unos a  otros compartiendo las experiencias que han tenido con el abuso de sustancias, y guiando a otros en el proceso de recuperacin. Muchos de [employer] se basan en el modelo de recuperacin de los 12 pasos y ofrecen sesiones para todo tipo de adiccin (por ejemplo, AA o Alcohlicos Annimos; NA o Narcticos Annimos).   Consejera individual para drogadependencia ("IDC", por sus siglas en ingls). Esta forma comn de terapia generalmente incorpora el modelo de la adiccin como enfermedad y la dimensin espiritual de la recuperacin al tiempo que tambin se enfoca en el cambio de comportamiento por medio de la participacin en programas de los 12 pasos.    [address] [employer] [address] [place], [place] [address]. Todos los derechos reservados. Esta informacin no pretende sustituir la atencin Actor. Slo su mdico puede diagnosticar y tratar un problema de salud.

## 2015-06-30 NOTE — Plan of Care (Signed)
Problem: Safe medical management of withdrawal from (list substances of abuse) AS EVIDENCED BY...  Goal: Compliance with management of withdrawal syndrome  Intervention: Assess withdrawal signs/symptoms according to identified protocol e.g. CIWA/COWS  Pacific interpreter no [phone] used for Spanish translation.  Pt c/o drowsiness related to medications.  Verbalized mild headache but did not want any medications that would make him sleepy.  MD notified and Tylenol PRN obtained and administered to pt with relief.  K 3.1 repleted with Kdur.  IV fluids continued.  CIWAS score monitored and PRN Ativan administered once per protocol.  Pt is cooperative and denies any symptoms aside from mild headache at this time.  V/s stable, will continue to monitor pt for any changes.

## 2015-06-30 NOTE — Plan of Care (Signed)
Problem: Health Promotion  Goal: Knowledge - health resources  Extent of understanding and conveyed about healthcare resources.   Pacific interpreter no [phone] used for translation.  Pt educated regarding discharge instruction, medications and follow up info.  Written materials provided on alcohol cessation and community resources to deal with addiction.  Pt counseled regarding alcohol cessation and verbalized understanding.  Prescription provided.  IV removed and telemetry discontinued.  Pt denies CP and SOB.  Pt verbalized understanding of teachings and all questions answered.  Pt is without complaint and v/s stable, niece at bedside to take patient home.

## 2015-07-03 NOTE — Progress Notes (Signed)
Mr. Otero discharged home with his niece and he will follow up with Dr. Ortencia Kick.  ETOH recovery and rehab resources given.     07/03/15 6578   Discharge Disposition   Patient preference/choice provided? N/A   Physical Discharge Disposition Home, No Needs   CM Interventions   Follow up appointment scheduled? Yes   Follow up appointment scheduled with: Other (comment)  (Dr. Ortencia Kick.  )   Multidisciplinary rounds/family meeting before d/c? Yes   Medicare Checklist   Is this a Medicare patient? No   Farrel Gordon, RN, BSN  Ext [phone]

## 2017-01-14 ENCOUNTER — Inpatient Hospital Stay (HOSPITAL_COMMUNITY)
Admission: EM | Admit: 2017-01-14 | Discharge: 2017-01-17 | DRG: 083 | Disposition: A | Discharge status: Home / Self Care | Payer: Self-pay | Attending: Internal Medicine | Admitting: Internal Medicine

## 2017-01-14 ENCOUNTER — Emergency Department (HOSPITAL_COMMUNITY): Payer: Self-pay

## 2017-01-14 ENCOUNTER — Encounter (HOSPITAL_COMMUNITY): Payer: Self-pay | Admitting: Emergency Medicine

## 2017-01-14 DIAGNOSIS — S020XXA Fracture of vault of skull, initial encounter for closed fracture: Secondary | ICD-10-CM | POA: Diagnosis present

## 2017-01-14 DIAGNOSIS — Z87891 Personal history of nicotine dependence: Secondary | ICD-10-CM

## 2017-01-14 DIAGNOSIS — I252 Old myocardial infarction: Secondary | ICD-10-CM

## 2017-01-14 DIAGNOSIS — S062X9A Diffuse traumatic brain injury with loss of consciousness of unspecified duration, initial encounter: Principal | ICD-10-CM | POA: Diagnosis present

## 2017-01-14 DIAGNOSIS — F1020 Alcohol dependence, uncomplicated: Secondary | ICD-10-CM

## 2017-01-14 DIAGNOSIS — X58XXXA Exposure to other specified factors, initial encounter: Secondary | ICD-10-CM | POA: Diagnosis present

## 2017-01-14 DIAGNOSIS — R51 Headache: Secondary | ICD-10-CM

## 2017-01-14 DIAGNOSIS — R519 Headache, unspecified: Secondary | ICD-10-CM

## 2017-01-14 DIAGNOSIS — I629 Nontraumatic intracranial hemorrhage, unspecified: Secondary | ICD-10-CM | POA: Diagnosis present

## 2017-01-14 DIAGNOSIS — S0219XA Other fracture of base of skull, initial encounter for closed fracture: Secondary | ICD-10-CM | POA: Diagnosis present

## 2017-01-14 DIAGNOSIS — E871 Hypo-osmolality and hyponatremia: Secondary | ICD-10-CM | POA: Diagnosis present

## 2017-01-14 DIAGNOSIS — R001 Bradycardia, unspecified: Secondary | ICD-10-CM | POA: Diagnosis present

## 2017-01-14 DIAGNOSIS — E860 Dehydration: Secondary | ICD-10-CM | POA: Diagnosis present

## 2017-01-14 HISTORY — DX: Alcohol dependence, uncomplicated: F10.20

## 2017-01-14 HISTORY — DX: Acute myocardial infarction, unspecified: I21.9

## 2017-01-14 LAB — COMPREHENSIVE METABOLIC PANEL
ALK PHOS: 73 U/L (ref 38–126)
ALT: 67 U/L — AB (ref 17–63)
AST: 46 U/L — ABNORMAL HIGH (ref 15–41)
Albumin: 3.6 g/dL (ref 3.5–5.0)
Anion gap: 12 (ref 5–15)
BILIRUBIN TOTAL: 0.9 mg/dL (ref 0.3–1.2)
BUN: 13 mg/dL (ref 6–20)
CALCIUM: 9.5 mg/dL (ref 8.9–10.3)
CO2: 27 mmol/L (ref 22–32)
CREATININE: 0.93 mg/dL (ref 0.61–1.24)
Chloride: 97 mmol/L — ABNORMAL LOW (ref 101–111)
GFR calc non Af Amer: >60 mL/min (ref >60)
Glucose, Bld: 106 mg/dL — ABNORMAL HIGH (ref 65–99)
Potassium: 3.7 mmol/L (ref 3.5–5.1)
SODIUM: 136 mmol/L (ref 135–145)
TOTAL PROTEIN: 7.3 g/dL (ref 6.5–8.1)

## 2017-01-14 LAB — CBC
HEMATOCRIT: 35 % — AB (ref 39.0–52.0)
HEMOGLOBIN: 11.8 g/dL — AB (ref 13.0–17.0)
MCH: 31.3 pg (ref 26.0–34.0)
MCHC: 33.7 g/dL (ref 30.0–36.0)
MCV: 92.8 fL (ref 78.0–100.0)
Platelets: 475 10*3/uL — ABNORMAL HIGH (ref 150–400)
RBC: 3.77 MIL/uL — AB (ref 4.22–5.81)
RDW: 13.5 % (ref 11.5–15.5)
WBC: 9.1 10*3/uL (ref 4.0–10.5)

## 2017-01-14 LAB — ETHANOL: Alcohol, Ethyl (B): <5 mg/dL (ref <5)

## 2017-01-14 MED ORDER — EPINEPHRINE PF 1 MG/10ML IJ SOSY
PREFILLED_SYRINGE | INTRAMUSCULAR | Status: AC
  Filled 2017-01-14: qty 10
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
## 2017-01-14 NOTE — ED Notes (Signed)
Spoke to Dr. Dalene Seltzer about patient.  Will place orders per Dr. Dalene Seltzer

## 2017-01-15 ENCOUNTER — Encounter (HOSPITAL_COMMUNITY): Payer: Self-pay

## 2017-01-15 ENCOUNTER — Inpatient Hospital Stay (HOSPITAL_COMMUNITY): Payer: Self-pay

## 2017-01-15 DIAGNOSIS — F1020 Alcohol dependence, uncomplicated: Secondary | ICD-10-CM

## 2017-01-15 DIAGNOSIS — I619 Nontraumatic intracerebral hemorrhage, unspecified: Secondary | ICD-10-CM

## 2017-01-15 DIAGNOSIS — I629 Nontraumatic intracranial hemorrhage, unspecified: Secondary | ICD-10-CM | POA: Diagnosis present

## 2017-01-15 LAB — URINALYSIS, ROUTINE W REFLEX MICROSCOPIC
Bilirubin Urine: NEGATIVE
Glucose, UA: NEGATIVE mg/dL
Hgb urine dipstick: NEGATIVE
KETONES UR: NEGATIVE mg/dL
LEUKOCYTES UA: NEGATIVE
NITRITE: NEGATIVE
PROTEIN: NEGATIVE mg/dL
Specific Gravity, Urine: 1.014 (ref 1.005–1.030)
pH: 7 (ref 5.0–8.0)

## 2017-01-15 LAB — CBC
HEMATOCRIT: 35.4 % — AB (ref 39.0–52.0)
HEMOGLOBIN: 11.7 g/dL — AB (ref 13.0–17.0)
MCH: 30.9 pg (ref 26.0–34.0)
MCHC: 33.1 g/dL (ref 30.0–36.0)
MCV: 93.4 fL (ref 78.0–100.0)
Platelets: 487 10*3/uL — ABNORMAL HIGH (ref 150–400)
RBC: 3.79 MIL/uL — ABNORMAL LOW (ref 4.22–5.81)
RDW: 13.6 % (ref 11.5–15.5)
WBC: 8.9 10*3/uL (ref 4.0–10.5)

## 2017-01-15 LAB — HIV ANTIBODY (ROUTINE TESTING W REFLEX): HIV SCREEN 4TH GENERATION: NONREACTIVE

## 2017-01-15 LAB — BASIC METABOLIC PANEL
ANION GAP: 10 (ref 5–15)
BUN: 9 mg/dL (ref 6–20)
CALCIUM: 9.3 mg/dL (ref 8.9–10.3)
CHLORIDE: 98 mmol/L — AB (ref 101–111)
CO2: 28 mmol/L (ref 22–32)
CREATININE: 0.83 mg/dL (ref 0.61–1.24)
GFR calc non Af Amer: >60 mL/min (ref >60)
Glucose, Bld: 112 mg/dL — ABNORMAL HIGH (ref 65–99)
Potassium: 3.8 mmol/L (ref 3.5–5.1)
Sodium: 136 mmol/L (ref 135–145)

## 2017-01-15 LAB — RAPID URINE DRUG SCREEN, HOSP PERFORMED
Amphetamines: NOT DETECTED
Barbiturates: POSITIVE — AB
Benzodiazepines: POSITIVE — AB
COCAINE: NOT DETECTED
OPIATES: POSITIVE — AB
TETRAHYDROCANNABINOL: NOT DETECTED

## 2017-01-15 LAB — MRSA PCR SCREENING: MRSA by PCR: NEGATIVE

## 2017-01-15 LAB — APTT: APTT: 32 s (ref 24–36)

## 2017-01-15 LAB — PROTIME-INR
INR: 1.02
Prothrombin Time: 13.4 seconds (ref 11.4–15.2)

## 2017-01-15 MED ORDER — LORAZEPAM 2 MG/ML IJ SOLN
1.0000 mg | Freq: Four times a day (QID) | INTRAMUSCULAR | Status: DC | PRN
  Administered 2017-01-15 (×3): 1 mg via INTRAVENOUS
  Filled 2017-01-15 (×3): qty 1

## 2017-01-15 MED ORDER — MORPHINE SULFATE (PF) 4 MG/ML IV SOLN
2.0000 mg | INTRAVENOUS | Status: DC | PRN
Start: 2017-01-15 — End: 2017-01-15
  Administered 2017-01-15: 2 mg via INTRAVENOUS
  Filled 2017-01-15: qty 1

## 2017-01-15 MED ORDER — LEVETIRACETAM 500 MG PO TABS
500.0000 mg | ORAL_TABLET | Freq: Two times a day (BID) | ORAL | Status: DC
  Administered 2017-01-15 – 2017-01-17 (×5): 500 mg via ORAL
  Filled 2017-01-15 (×5): qty 1

## 2017-01-15 MED ORDER — ONDANSETRON HCL 4 MG PO TABS: 4.0000 mg | ORAL_TABLET | Freq: Four times a day (QID) | ORAL | Status: DC | PRN

## 2017-01-15 MED ORDER — VITAMIN B-1 100 MG PO TABS
100.0000 mg | ORAL_TABLET | Freq: Every day | ORAL | Status: DC
  Administered 2017-01-15 – 2017-01-17 (×3): 100 mg via ORAL
  Filled 2017-01-15 (×3): qty 1

## 2017-01-15 MED ORDER — LORAZEPAM 1 MG PO TABS: 1.0000 mg | ORAL_TABLET | Freq: Four times a day (QID) | ORAL | Status: DC | PRN

## 2017-01-15 MED ORDER — LEVETIRACETAM 500 MG/5ML IV SOLN
500.0000 mg | Freq: Two times a day (BID) | INTRAVENOUS | Status: DC
  Administered 2017-01-15: 500 mg via INTRAVENOUS
  Filled 2017-01-15 (×2): qty 5

## 2017-01-15 MED ORDER — LORAZEPAM BOLUS VIA INFUSION: 0.5000 mg | Freq: Once | INTRAVENOUS | Status: DC

## 2017-01-15 MED ORDER — LORAZEPAM 2 MG/ML IJ SOLN
0.5000 mg | Freq: Once | INTRAMUSCULAR | Status: AC
  Administered 2017-01-15: 0.5 mg via INTRAVENOUS
  Filled 2017-01-15: qty 1

## 2017-01-15 MED ORDER — ACETAMINOPHEN 650 MG RE SUPP: 650.0000 mg | Freq: Four times a day (QID) | RECTAL | Status: DC | PRN

## 2017-01-15 MED ORDER — ADULT MULTIVITAMIN W/MINERALS CH
1.0000 | ORAL_TABLET | Freq: Every day | ORAL | Status: DC
  Administered 2017-01-15 – 2017-01-17 (×3): 1 via ORAL
  Filled 2017-01-15 (×3): qty 1

## 2017-01-15 MED ORDER — ACETAMINOPHEN 325 MG PO TABS
650.0000 mg | ORAL_TABLET | ORAL | Status: DC | PRN
  Administered 2017-01-16 – 2017-01-17 (×8): 650 mg via ORAL
  Filled 2017-01-15 (×8): qty 2

## 2017-01-15 MED ORDER — ACETAMINOPHEN 325 MG PO TABS
650.0000 mg | ORAL_TABLET | Freq: Four times a day (QID) | ORAL | Status: DC | PRN
  Filled 2017-01-15: qty 2

## 2017-01-15 MED ORDER — ONDANSETRON HCL 4 MG/2ML IJ SOLN
4.0000 mg | Freq: Four times a day (QID) | INTRAMUSCULAR | Status: DC | PRN
  Administered 2017-01-15 (×2): 4 mg via INTRAVENOUS
  Administered 2017-01-17: 8 mg via INTRAVENOUS
  Filled 2017-01-15 (×2): qty 2

## 2017-01-15 MED ORDER — PNEUMOCOCCAL VAC POLYVALENT 25 MCG/0.5ML IJ INJ: 0.5000 mL | INJECTION | INTRAMUSCULAR | Status: DC

## 2017-01-15 MED ORDER — FOLIC ACID 1 MG PO TABS
1.0000 mg | ORAL_TABLET | Freq: Every day | ORAL | Status: DC
  Administered 2017-01-15 – 2017-01-17 (×3): 1 mg via ORAL
  Filled 2017-01-15 (×3): qty 1

## 2017-01-15 MED ORDER — THIAMINE HCL 100 MG/ML IJ SOLN
100.0000 mg | Freq: Every day | INTRAMUSCULAR | Status: DC
  Filled 2017-01-15: qty 2

## 2017-01-15 NOTE — Consult Note (Signed)
Reason for Consult:closed head injury, cerebral contusions, subdural hematoma Referring Physician: [name], Shawn Rosales is an 46 y.o. male.  HPI: whom arrived in the ED last night complaining of headaches. Reported that he had been admitted to another hospital last week for head injury, no records available Now at [facility], head ct showed left frontal contusions, possible epidural hematoma   Past Medical History:  Diagnosis Date  . Alcoholism ([place])   . Myocardial infarction Shawn Rosales)     History reviewed. No pertinent surgical history.  Family History  Problem Relation Age of Onset  . Diabetes Father   . Alcohol abuse Father   . Diabetes Sister     Social History:  reports that he has quit smoking. He has never used smokeless tobacco. He reports that he drinks alcohol. He reports that he does not use drugs.  Allergies: No Known Allergies  Medications: I have reviewed the patient's current medications.  Results for orders placed or performed during the hospital encounter of 01/14/17 (from the past 48 hour(s))  Protime-INR     Status: None   Collection Time: 01/14/17 12:17 AM  Result Value Ref Range   Prothrombin Time 13.4 11.4 - 15.2 seconds   INR 1.02   APTT     Status: None   Collection Time: 01/14/17 12:17 AM  Result Value Ref Range   aPTT 32 24 - 36 seconds  CBC     Status: Abnormal   Collection Time: 01/14/17  9:41 PM  Result Value Ref Range   WBC 9.1 4.0 - 10.5 K/uL   RBC 3.77 (L) 4.22 - 5.81 MIL/uL   Hemoglobin 11.8 (L) 13.0 - 17.0 g/dL   HCT 35.0 (L) 39.0 - 52.0 %   MCV 92.8 78.0 - 100.0 fL   MCH 31.3 26.0 - 34.0 pg   MCHC 33.7 30.0 - 36.0 g/dL   RDW 13.5 11.5 - 15.5 %   Platelets 475 (H) 150 - 400 K/uL  Comprehensive metabolic panel     Status: Abnormal   Collection Time: 01/14/17  9:41 PM  Result Value Ref Range   Sodium 136 135 - 145 mmol/L   Potassium 3.7 3.5 - 5.1 mmol/L   Chloride 97 (L) 101 - 111 mmol/L   CO2 27 22 - 32 mmol/L   Glucose, Bld 106 (H)  65 - 99 mg/dL   BUN 13 6 - 20 mg/dL   Creatinine, Ser 0.93 0.61 - 1.24 mg/dL   Calcium 9.5 8.9 - 10.3 mg/dL   Total Protein 7.3 6.5 - 8.1 g/dL   Albumin 3.6 3.5 - 5.0 g/dL   AST 46 (H) 15 - 41 U/L   ALT 67 (H) 17 - 63 U/L   Alkaline Phosphatase 73 38 - 126 U/L   Total Bilirubin 0.9 0.3 - 1.2 mg/dL   GFR calc non Af Amer >60 >60 mL/min   GFR calc Af Amer >60 >60 mL/min    Comment: (NOTE) The eGFR has been calculated using the CKD EPI equation. This calculation has not been validated in all clinical situations. eGFR's persistently <60 mL/min signify possible Chronic Kidney Disease.    Anion gap 12 5 - 15  Ethanol     Status: None   Collection Time: 01/14/17  9:41 PM  Result Value Ref Range   Alcohol, Ethyl (B) <5 <5 mg/dL    Comment:        LOWEST DETECTABLE LIMIT FOR SERUM ALCOHOL IS 5 mg/dL FOR MEDICAL PURPOSES ONLY  CBC     Status: Abnormal   Collection Time: 01/15/17  3:46 AM  Result Value Ref Range   WBC 8.9 4.0 - 10.5 K/uL   RBC 3.79 (L) 4.22 - 5.81 MIL/uL   Hemoglobin 11.7 (L) 13.0 - 17.0 g/dL   HCT 35.4 (L) 39.0 - 52.0 %   MCV 93.4 78.0 - 100.0 fL   MCH 30.9 26.0 - 34.0 pg   MCHC 33.1 30.0 - 36.0 g/dL   RDW 13.6 11.5 - 15.5 %   Platelets 487 (H) 150 - 400 K/uL  Basic metabolic panel     Status: Abnormal   Collection Time: 01/15/17  3:46 AM  Result Value Ref Range   Sodium 136 135 - 145 mmol/L   Potassium 3.8 3.5 - 5.1 mmol/L   Chloride 98 (L) 101 - 111 mmol/L   CO2 28 22 - 32 mmol/L   Glucose, Bld 112 (H) 65 - 99 mg/dL   BUN 9 6 - 20 mg/dL   Creatinine, Ser 0.83 0.61 - 1.24 mg/dL   Calcium 9.3 8.9 - 10.3 mg/dL   GFR calc non Af Amer >60 >60 mL/min   GFR calc Af Amer >60 >60 mL/min    Comment: (NOTE) The eGFR has been calculated using the CKD EPI equation. This calculation has not been validated in all clinical situations. eGFR's persistently <60 mL/min signify possible Chronic Kidney Disease.    Anion gap 10 5 - 15  Rapid urine drug screen (hospital  performed)     Status: Abnormal   Collection Time: 01/15/17  4:45 AM  Result Value Ref Range   Opiates POSITIVE (A) NONE DETECTED   Cocaine NONE DETECTED NONE DETECTED   Benzodiazepines POSITIVE (A) NONE DETECTED   Amphetamines NONE DETECTED NONE DETECTED   Tetrahydrocannabinol NONE DETECTED NONE DETECTED   Barbiturates POSITIVE (A) NONE DETECTED    Comment:        DRUG SCREEN FOR MEDICAL PURPOSES ONLY.  IF CONFIRMATION IS NEEDED FOR ANY PURPOSE, NOTIFY LAB WITHIN 5 DAYS.        LOWEST DETECTABLE LIMITS FOR URINE DRUG SCREEN Drug Class       Cutoff (ng/mL) Amphetamine      1000 Barbiturate      200 Benzodiazepine   938 Tricyclics       101 Opiates          300 Cocaine          300 THC              50   Urinalysis, Routine w reflex microscopic     Status: None   Collection Time: 01/15/17  4:45 AM  Result Value Ref Range   Color, Urine YELLOW YELLOW   APPearance CLEAR CLEAR   Specific Gravity, Urine 1.014 1.005 - 1.030   pH 7.0 5.0 - 8.0   Glucose, UA NEGATIVE NEGATIVE mg/dL   Hgb urine dipstick NEGATIVE NEGATIVE   Bilirubin Urine NEGATIVE NEGATIVE   Ketones, ur NEGATIVE NEGATIVE mg/dL   Protein, ur NEGATIVE NEGATIVE mg/dL   Nitrite NEGATIVE NEGATIVE   Leukocytes, UA NEGATIVE NEGATIVE  MRSA PCR Screening     Status: None   Collection Time: 01/15/17  6:42 AM  Result Value Ref Range   MRSA by PCR NEGATIVE NEGATIVE    Comment:        The GeneXpert MRSA Assay (FDA approved for NASAL specimens only), is one component of a comprehensive MRSA colonization surveillance program. It is not intended to diagnose  MRSA infection nor to guide or monitor treatment for MRSA infections.     Ct Head Wo Contrast  Result Date: 01/14/2017 CLINICAL DATA:  Acute onset of confusion. Bruising about the face. Initial encounter. EXAM: CT HEAD WITHOUT CONTRAST TECHNIQUE: Contiguous axial images were obtained from the base of the skull through the vertex without intravenous contrast.  COMPARISON:  None. FINDINGS: Brain: Acute intraparenchymal contusion is noted involving the left gyrus rectus, extending along the cribriform plate and over the left orbital roof, in a region measuring approximately 5.1 x 2.0 x 3.2 cm. There is an acute focus of hemorrhage overlying the left frontal lobe, measuring 1.2 cm in thickness. Its lentiform appearance raises question for epidural hematoma, though on sagittal images, this appears to be mostly intra-axial in nature. An underlying small amount of extra-axial hemorrhage is likely present, more likely subdural in nature. Surrounding edema is seen tracking about the left frontal lobe, with mass effect and 3 mm of rightward midline shift. Note is also made of a small 3 mm acute subdural hematoma overlying the right temporoparietal region. There is no evidence of hydrocephalus at this time. Mild cerebellar atrophy is noted. The brainstem and fourth ventricle are within normal limits. The basal ganglia are unremarkable in appearance. Vascular: No hyperdense vessel or unexpected calcification. Skull: There is an essentially nondisplaced fracture extending across the inner table of the left frontal calvarium into the left orbital roof. No additional fractures are seen. Sinuses/Orbits: The visualized portions of the orbits are otherwise within normal limits. There is partial opacification of the maxillary sinuses bilaterally. The remaining paranasal sinuses and mastoid air cells are well-aerated. Other: No additional soft tissue abnormalities are seen. IMPRESSION: 1. Acute focus of hemorrhage overlying the left frontal lobe, measuring 1.2 cm in thickness. Its lentiform appearance on a few images raises question for epidural hematoma, with an overlying calvarial fracture as described below, though on sagittal images, it appears to be mostly intra-axial in nature. An underlying small amount of extra-axial hemorrhage is likely present, more likely subdural in nature.  Close interval imaging follow-up is recommended, within 8 hours. 2. Acute intraparenchymal contusion involving the left gyrus rectus, extending along the cribriform plate and over the left orbital roof, in a region measuring approximately 5.1 x 2.0 x 3.2 cm. 3. Surrounding edema noted about the left frontal lobe, with mass effect and 3 mm of rightward midline shift. 4. Small 3 mm acute subdural hematoma overlying the right temporoparietal region. 5. Essentially nondisplaced fracture extending across the inner table of the left frontal calvarium into the left orbital roof. 6. Partial opacification of the maxillary sinuses bilaterally. Critical Value/emergent results were called by telephone at the time of interpretation on 01/14/2017 at 10:33 pm to Dr. Ellender Hose, who verbally acknowledged these results. Electronically Signed   By: Garald Balding M.D.   On: 01/14/2017 22:44    Review of Systems  Unable to perform ROS: Mental acuity   Blood pressure 140/80, pulse 72, temperature 100.1 F (37.8 C), temperature source Oral, resp. rate 13, weight 70 kg (154 lb 6.4 oz), SpO2 97 %. Physical Exam  Constitutional: He appears well-developed and well-nourished.  HENT:  Bruising about forehead  Eyes: Conjunctivae and EOM are normal. Pupils are equal, round, and reactive to light.  Neck: Normal range of motion. Neck supple.  Cardiovascular: Normal rate, regular rhythm, normal heart sounds and intact distal pulses.   Respiratory: Effort normal and breath sounds normal.  GI: Soft. Bowel sounds are normal.  Musculoskeletal:  Normal range of motion.  Neurological: He is alert. He has normal strength. No cranial nerve deficit. Coordination normal.  Is answering all questions, language barrier does exist Moving all extremities Cannot perform detailed sensory exam Did not assess gait Unable to perform detailed motor exam, moving symmetrically    Assessment/Plan: 46 yo with no memory of yesterday. No reliable  history. Will need to be watched for withdrawal. No operative lesions identified.  Believe rehab might be beneficial Awaiting repeat ct  Shawn Rosales L 01/15/2017, 12:43 PM

## 2017-01-15 NOTE — Progress Notes (Signed)
Md paged. Pt c/o head pain 4/10. No medication on MAR.  Will continue to monitor Karena Addison T

## 2017-01-15 NOTE — Progress Notes (Signed)
RN utilized [employer] with family to see what questions they had and to answer them within her scope of practice. Family are aware that MD will share with them results of repeat CT and prognosis.

## 2017-01-15 NOTE — H&P (Signed)
History and Physical    Shawn Rosales ZOX:[id number] DOB: [birth year] DOA: 01/14/2017  PCP: No PCP Per Patient   Patient coming from: Home  Chief Complaint: Intermittent confusion, gait instability  HPI: Shawn Rosales is a 46 y.o. 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  The patient is Spanish speaking, and this H and P was completed with Spanish interpreter via the iPad.  The patient does not comply with much of the interview or physical exam, so family also assists with history via the interpreter.  Apparently, the patient left for [place] last week with friends.  At some point, he ended up in the hospital in [place] and was told that he had an intracerebral hemorrhage.  He does not recall the circumstances leading to the hospitalization though (fall, trauma, withdrawal, etc).  Reportedly, he was there for four days then discharged (he actually has prescriptions for fioricet as needed for headache and Keppra  BID presumably for seizure prophylaxis).  Family went to [place] to pick him up and noted persistent symptoms as described above, so the patient was brought to the ED for further evaluation.  Patient still has intermittent headache.  He has had intermittent emesis; sometime bloody.  He complains of mild abdominal pain.  No dysuria.  No diarrhea.  No chest pain or shortness of breath.  Again, patient cannot tell the type of trauma that was sustained.  Medical records from [place] requested in the ED.  ED Course: Head CT shows acute focus of hemorrhage overlying the left frontal lobe, appearance suggestive of epidural hematoma with overlying calvarial fracture.  There is left intraparenchymal contusion involving the left gyrus rectus.  Edema in the left frontal lobe with mass effect and 3mm midline shift.  Neurosurgery  consulted from the ED; patient will be seen in the AM.  Hospitalist asked to admit.  Review of Systems: As per HPI otherwise 10 systems reviewed and negative.   Past Medical History:  Diagnosis Date  . Alcoholism ([facility])     History reviewed. No pertinent surgical history.  He denies any major surgeries.   reports that he has quit smoking. He has never used smokeless tobacco. He reports that he drinks alcohol. He reports that he does not use drugs.  From [place]  Family reports he drinks beer "all day, every day".  Time of last drink unclear.  They have never witness alcohol withdrawal in this patient.  No Known Allergies  Family History  Problem Relation Age of Onset  . Diabetes Father   . Alcohol abuse Father   . Diabetes Sister      Prior to Admission medications   Not on File    Physical Exam: Vitals:   01/15/17 0045 01/15/17 0100 01/15/17 0245 01/15/17 0300  BP: (!) 126/91 117/73 100/62 121/88  Pulse: 67 74 75 65  Resp:      Temp:      TempSrc:      SpO2: 98% 100% 98% 99%      Constitutional: NAD, calm, restless, intermittent agitation/frustration, complaining of headache Vitals:   01/15/17 0045 01/15/17 0100 01/15/17 0245 01/15/17 0300  BP: (!) 126/91 117/73 100/62 121/88  Pulse: 67 74 75 65  Resp:      Temp:      TempSrc:      SpO2: 98% 100% 98% 99%  Eyes: PERRL, lids and conjunctivae normal ENMT: Mucous membranes are moist. Posterior pharynx clear of any exudate or lesions. Normal dentition.  Neck: normal appearance, supple, no masses Respiratory: clear to auscultation bilaterally, no wheezing, no crackles. Normal respiratory effort. No accessory muscle use.  Cardiovascular: Normal rate, regular rhythm, no murmurs / rubs / gallops. No extremity edema. 2+ pedal pulses. No carotid bruits.  GI: abdomen is soft and compressible.  No distention.  No tenderness.  Bowel sounds are present. Musculoskeletal:  No joint deformity in upper and lower  extremities. Good ROM, no contractures. Normal muscle tone.  Skin: no rashes, warm and dry, small scalp laceration, several skin excoriations to knuckles on right hand Neurologic: CN 2-12 grossly intact. Strength symmetric bilaterally. Psychiatric: Awake and alert but impaired judgment, insight.  Agitated.     Labs on Admission: I have personally reviewed following labs and imaging studies  CBC:  Recent Labs Lab 01/14/17 2141  WBC 9.1  HGB 11.8*  HCT 35.0*  MCV 92.8  PLT 475*   Basic Metabolic Panel:  Recent Labs Lab 01/14/17 2141  NA 136  K 3.7  CL 97*  CO2 27  GLUCOSE 106*  BUN 13  CREATININE 0.93  CALCIUM 9.5   GFR: CrCl cannot be calculated (Unknown ideal weight.). Liver Function Tests:  Recent Labs Lab 01/14/17 2141  AST 46*  ALT 67*  ALKPHOS 73  BILITOT 0.9  PROT 7.3  ALBUMIN 3.6   Coagulation Profile:  Recent Labs Lab 01/14/17 0017  INR 1.02    Radiological Exams on Admission: Ct Head Wo Contrast  Result Date: 01/14/2017 CLINICAL DATA:  Acute onset of confusion. Bruising about the face. Initial encounter. EXAM: CT HEAD WITHOUT CONTRAST TECHNIQUE: Contiguous axial images were obtained from the base of the skull through the vertex without intravenous contrast. COMPARISON:  None. FINDINGS: Brain: Acute intraparenchymal contusion is noted involving the left gyrus rectus, extending along the cribriform plate and over the left orbital roof, in a region measuring approximately 5.1 x 2.0 x 3.2 cm. There is an acute focus of hemorrhage overlying the left frontal lobe, measuring 1.2 cm in thickness. Its lentiform appearance raises question for epidural hematoma, though on sagittal images, this appears to be mostly intra-axial in nature. An underlying small amount of extra-axial hemorrhage is likely present, more likely subdural in nature. Surrounding edema is seen tracking about the left frontal lobe, with mass effect and 3 mm of rightward midline shift. Note  is also made of a small 3 mm acute subdural hematoma overlying the right temporoparietal region. There is no evidence of hydrocephalus at this time. Mild cerebellar atrophy is noted. The brainstem and fourth ventricle are within normal limits. The basal ganglia are unremarkable in appearance. Vascular: No hyperdense vessel or unexpected calcification. Skull: There is an essentially nondisplaced fracture extending across the inner table of the left frontal calvarium into the left orbital roof. No additional fractures are seen. Sinuses/Orbits: The visualized portions of the orbits are otherwise within normal limits. There is partial opacification of the maxillary sinuses bilaterally. The remaining paranasal sinuses and mastoid air cells are well-aerated. Other: No additional soft tissue abnormalities are seen. IMPRESSION: 1. Acute focus of hemorrhage overlying the left frontal lobe, measuring 1.2 cm in thickness. Its lentiform appearance on a few images raises question for epidural hematoma, with an overlying calvarial fracture as described below, though on sagittal images, it appears to be mostly intra-axial in nature. An underlying small amount of extra-axial hemorrhage is likely present,  more likely subdural in nature. Close interval imaging follow-up is recommended, within 8 hours. 2. Acute intraparenchymal contusion involving the left gyrus rectus, extending along the cribriform plate and over the left orbital roof, in a region measuring approximately 5.1 x 2.0 x 3.2 cm. 3. Surrounding edema noted about the left frontal lobe, with mass effect and 3 mm of rightward midline shift. 4. Small 3 mm acute subdural hematoma overlying the right temporoparietal region. 5. Essentially nondisplaced fracture extending across the inner table of the left frontal calvarium into the left orbital roof. 6. Partial opacification of the maxillary sinuses bilaterally. Critical Value/emergent results were called by telephone at the  time of interpretation on 01/14/2017 at 10:33 pm to Dr. Erma Heritage, who verbally acknowledged these results. Electronically Signed   By: Roanna Raider M.D.   On: 01/14/2017 22:44    EKG: Independently reviewed. EKG shows NSR.  No acute ST elevation.  He has J point elevation in some leads.  Assessment/Plan Principal Problem:   Cerebral hemorrhage ([facility]) Active Problems:   Alcoholism ([facility])      Intracerebral hemorrhage --Neurosurgery to see in the AM --Continue Keppra --Analgesics as needed --NPO for now --Avoid anticoagulants --Records from [place] will need to be reviewed.  Alcohol dependence --CIWA protocol --MVI, thiamine, folate   DVT prophylaxis: SCDs, NO anticoagulants Code Status: FULL Family Communication: Multiple family members including sister and daughter present at bedside in the ED. Disposition Plan: To be determined. Consults called: Neurosurgery Admission status: Inpatient, stepdown unit   TIME SPENT: 60 minutes   Jerene Bears MD [facility] Pager [phone]  If 7PM-7AM, please contact night-coverage [email or web address] Password [email or web address]  01/15/2017, 3:30 AM
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
## 2017-01-16 LAB — BASIC METABOLIC PANEL
ANION GAP: 8 (ref 5–15)
BUN: 8 mg/dL (ref 6–20)
CHLORIDE: 95 mmol/L — AB (ref 101–111)
CO2: 26 mmol/L (ref 22–32)
Calcium: 9.2 mg/dL (ref 8.9–10.3)
Creatinine, Ser: 0.76 mg/dL (ref 0.61–1.24)
GFR calc Af Amer: >60 mL/min (ref >60)
GFR calc non Af Amer: >60 mL/min (ref >60)
Glucose, Bld: 121 mg/dL — ABNORMAL HIGH (ref 65–99)
POTASSIUM: 4.3 mmol/L (ref 3.5–5.1)
SODIUM: 129 mmol/L — AB (ref 135–145)

## 2017-01-16 LAB — COMPREHENSIVE METABOLIC PANEL
ALBUMIN: 3.6 g/dL (ref 3.5–5.0)
ALT: 64 U/L — AB (ref 17–63)
AST: 44 U/L — AB (ref 15–41)
Alkaline Phosphatase: 74 U/L (ref 38–126)
Anion gap: 9 (ref 5–15)
BILIRUBIN TOTAL: 0.8 mg/dL (ref 0.3–1.2)
BUN: 7 mg/dL (ref 6–20)
CHLORIDE: 97 mmol/L — AB (ref 101–111)
CO2: 24 mmol/L (ref 22–32)
CREATININE: 0.69 mg/dL (ref 0.61–1.24)
Calcium: 9.6 mg/dL (ref 8.9–10.3)
GFR calc Af Amer: >60 mL/min (ref >60)
GLUCOSE: 117 mg/dL — AB (ref 65–99)
POTASSIUM: 4 mmol/L (ref 3.5–5.1)
Sodium: 130 mmol/L — ABNORMAL LOW (ref 135–145)
Total Protein: 7.9 g/dL (ref 6.5–8.1)

## 2017-01-16 LAB — CBC
HCT: 35.6 % — ABNORMAL LOW (ref 39.0–52.0)
Hemoglobin: 11.9 g/dL — ABNORMAL LOW (ref 13.0–17.0)
MCH: 31 pg (ref 26.0–34.0)
MCHC: 33.4 g/dL (ref 30.0–36.0)
MCV: 92.7 fL (ref 78.0–100.0)
Platelets: 541 10*3/uL — ABNORMAL HIGH (ref 150–400)
RBC: 3.84 MIL/uL — ABNORMAL LOW (ref 4.22–5.81)
RDW: 13.3 % (ref 11.5–15.5)
WBC: 10.2 10*3/uL (ref 4.0–10.5)

## 2017-01-16 LAB — OSMOLALITY: OSMOLALITY: 279 mosm/kg (ref 275–295)

## 2017-01-16 LAB — HIV ANTIBODY (ROUTINE TESTING W REFLEX): HIV Screen 4th Generation wRfx: NONREACTIVE

## 2017-01-16 LAB — URIC ACID: Uric Acid, Serum: 3.2 mg/dL — ABNORMAL LOW (ref 4.4–7.6)

## 2017-01-16 LAB — OSMOLALITY, URINE: Osmolality, Ur: 264 mOsm/kg — ABNORMAL LOW (ref 300–900)

## 2017-01-16 LAB — SODIUM, URINE, RANDOM: Sodium, Ur: 38 mmol/L

## 2017-01-16 MED ORDER — SODIUM CHLORIDE 0.9 % IV SOLN: INTRAVENOUS | Status: DC

## 2017-01-16 MED ORDER — SODIUM CHLORIDE 0.9 % IV SOLN
INTRAVENOUS | Status: DC
  Administered 2017-01-16: 10:00:00 via INTRAVENOUS
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
## 2017-01-16 NOTE — Progress Notes (Signed)
Notified Md about pt having pauses.  1.7-2.4 sec pause.  Hr 40-60.  Pt was sleeping.  bp 112/64. When awaken  Pt did say was little dizzy.  Will continue to monitor Karena Addison T

## 2017-01-16 NOTE — Progress Notes (Signed)
Patient ID: Shawn Rosales, male   DOB: 1971/08/22, 46 y.o.   MRN: [id number] Alert and oriented x 4, speech is clear and fluent Unlikely that intracranial blood could be source for bradycardia, very little blood, no edema, intracranial pressure is normal Much better today Moving all extremities well May be discharged from my standpoint.

## 2017-01-16 NOTE — Progress Notes (Signed)
EKG obtained. md notified. Will continue to monitor Shawn Rosales T

## 2017-01-17 DIAGNOSIS — I629 Nontraumatic intracranial hemorrhage, unspecified: Secondary | ICD-10-CM

## 2017-01-17 LAB — BASIC METABOLIC PANEL
Anion gap: 12 (ref 5–15)
BUN: 7 mg/dL (ref 6–20)
CHLORIDE: 96 mmol/L — AB (ref 101–111)
CO2: 25 mmol/L (ref 22–32)
Calcium: 9.5 mg/dL (ref 8.9–10.3)
Creatinine, Ser: 0.68 mg/dL (ref 0.61–1.24)
GFR calc Af Amer: >60 mL/min (ref >60)
Glucose, Bld: 121 mg/dL — ABNORMAL HIGH (ref 65–99)
POTASSIUM: 4.1 mmol/L (ref 3.5–5.1)
SODIUM: 133 mmol/L — AB (ref 135–145)

## 2017-01-17 LAB — TSH: TSH: 0.817 u[IU]/mL (ref 0.350–4.500)

## 2017-01-17 MED ORDER — ONDANSETRON HCL 4 MG/2ML IJ SOLN
INTRAMUSCULAR | Status: AC
  Filled 2017-01-17: qty 4

## 2017-01-17 MED ORDER — THIAMINE HCL 100 MG PO TABS: 100.0000 mg | ORAL_TABLET | Freq: Every day | ORAL | 0 refills | Status: DC

## 2017-01-17 MED ORDER — PROMETHAZINE HCL 25 MG PO TABS: 25.0000 mg | ORAL_TABLET | Freq: Four times a day (QID) | ORAL | 0 refills | Status: DC | PRN

## 2017-01-17 MED ORDER — ACETAMINOPHEN 325 MG PO TABS: 650.0000 mg | ORAL_TABLET | Freq: Four times a day (QID) | ORAL | 0 refills | Status: DC | PRN

## 2017-01-17 MED ORDER — FOLIC ACID 1 MG PO TABS: 1.0000 mg | ORAL_TABLET | Freq: Every day | ORAL | 0 refills | Status: AC

## 2017-01-17 MED ORDER — ONDANSETRON HCL 4 MG PO TABS: 4.0000 mg | ORAL_TABLET | Freq: Four times a day (QID) | ORAL | 0 refills | Status: DC | PRN

## 2017-01-17 MED ORDER — LEVETIRACETAM 500 MG PO TABS: 500.0000 mg | ORAL_TABLET | Freq: Two times a day (BID) | ORAL | 0 refills | Status: AC

## 2017-01-17 MED ORDER — SODIUM CHLORIDE 0.9 % IV SOLN
8.0000 mg | Freq: Once | INTRAVENOUS | Status: AC
  Filled 2017-01-17: qty 4

## 2017-01-17 MED ORDER — PHENTERMINE HCL 30 MG PO CAPS: 30.0000 mg | ORAL_CAPSULE | ORAL | 0 refills | Status: DC
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
## 2017-01-17 NOTE — Progress Notes (Signed)
CSW unavailable; no response to messages left; ED CSW sent up information via tube system for ETOH/substance abuse rehab facilities; information reviewed w/patient and family along w/discharge instructions.
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
## 2017-01-17 NOTE — Progress Notes (Signed)
All care provided w/assist of spanish interpreting services.  Pt states HA is still 7/10 and on-going since admission.  Nausea has improved since Zofran administration and emesis episode.  PT has requested to ambulate patient and patient is in agreement w/walking w/PT.  Expected discharge later today.

## 2017-01-17 NOTE — Care Management Note (Signed)
Case Management Note  Patient Details  Name: Shawn Rosales MRN: [id number] Date of Birth: 06/28/1971  Subjective/Objective:   Pt admitted with ETOH and headache                  Action/Plan:   PTA from home independent with family.  CM spoke with pt via translator - Pt wants [facility] appt - CM provided brochure in English and [facility] and urged pt to contact ASAP for follow up appt.  CM also provided MATCH letter in [place].    Expected Discharge Date:  01/17/17               Expected Discharge Plan:  Home/Self Care  In-House Referral:     Discharge planning Services  CM Consult  Post Acute Care Choice:    Choice offered to:     DME Arranged:    DME Agency:     HH Arranged:    HH Agency:     Status of Service:     If discussed at [employer], dates discussed:    Additional Comments:  Cherylann Parr, RN 01/17/2017, 1:34 PM

## 2017-01-17 NOTE — Discharge Instructions (Signed)
Follow with Primary MD  in 7 days  ° °Get CBC, CMP, 2 view Chest X ray checked  by Primary MD or SNF MD in 5-7 days ( we routinely change or add medications that can affect your baseline labs and fluid status, therefore we recommend that you get the mentioned basic workup next visit with your PCP, your PCP may decide not to get them or add new tests based on their clinical decision) ° °Activity: As tolerated with Full fall precautions use walker/cane & assistance as needed ° °Disposition Home   ° °Diet:   Heart Healthy   ° °For Heart failure patients - Check your Weight same time everyday, if you gain over 2 pounds, or you develop in leg swelling, experience more shortness of breath or chest pain, call your Primary MD immediately. Follow Cardiac Low Salt Diet and 1.5 lit/day fluid restriction. ° °On your next visit with your primary care physician please Get Medicines reviewed and adjusted. ° °Please request your Prim.MD to go over all Hospital Tests and Procedure/Radiological results at the follow up, please get all Hospital records sent to your Prim MD by signing hospital release before you go home. ° °If you experience worsening of your admission symptoms, develop shortness of breath, life threatening emergency, suicidal or homicidal thoughts you must seek medical attention immediately by calling 911 or calling your MD immediately  if symptoms less severe. ° °You Must read complete instructions/literature along with all the possible adverse reactions/side effects for all the Medicines you take and that have been prescribed to you. Take any new Medicines after you have completely understood and accpet all the possible adverse reactions/side effects.  ° °Do not drive, operate heavy machinery, perform activities at heights, swimming or participation in water activities or provide baby sitting services if your were admitted for syncope or siezures until you have seen by Primary MD or a Neurologist and advised to do  so again. ° °Do not drive when taking Pain medications.  ° ° °Do not take more than prescribed Pain, Sleep and Anxiety Medications ° °Special Instructions: If you have smoked or chewed Tobacco  in the last 2 yrs please stop smoking, stop any regular Alcohol  and or any Recreational drug use. ° °Wear Seat belts while driving. ° ° °Please note ° °You were cared for by a hospitalist during your hospital stay. If you have any questions about your discharge medications or the care you received while you were in the hospital after you are discharged, you can call the unit and asked to speak with the hospitalist on call if the hospitalist that took care of you is not available. Once you are discharged, your primary care physician will handle any further medical issues. Please note that NO REFILLS for any discharge medications will be authorized once you are discharged, as it is imperative that you return to your primary care physician (or establish a relationship with a primary care physician if you do not have one) for your aftercare needs so that they can reassess your need for medications and monitor your lab values. ° °

## 2017-01-19 ENCOUNTER — Other Ambulatory Visit: Payer: Self-pay | Admitting: Student

## 2017-01-19 DIAGNOSIS — R55 Syncope and collapse: Secondary | ICD-10-CM

## 2017-01-20 ENCOUNTER — Ambulatory Visit (INDEPENDENT_AMBULATORY_CARE_PROVIDER_SITE_OTHER): Payer: Self-pay | Admitting: Physician Assistant

## 2017-01-20 ENCOUNTER — Emergency Department (HOSPITAL_COMMUNITY)
Admission: EM | Admit: 2017-01-20 | Discharge: 2017-01-20 | Disposition: A | Discharge status: Home / Self Care | Payer: Self-pay | Attending: Emergency Medicine | Admitting: Emergency Medicine

## 2017-01-20 ENCOUNTER — Encounter (INDEPENDENT_AMBULATORY_CARE_PROVIDER_SITE_OTHER): Payer: Self-pay | Admitting: Physician Assistant

## 2017-01-20 ENCOUNTER — Emergency Department (HOSPITAL_COMMUNITY): Payer: Self-pay

## 2017-01-20 ENCOUNTER — Encounter (HOSPITAL_COMMUNITY): Payer: Self-pay | Admitting: Emergency Medicine

## 2017-01-20 VITALS — BP 116/80 | HR 92 | Temp 98.4°F | Ht 62.5 in | Wt 155.4 lb

## 2017-01-20 DIAGNOSIS — S0291XA Unspecified fracture of skull, initial encounter for closed fracture: Secondary | ICD-10-CM | POA: Insufficient documentation

## 2017-01-20 DIAGNOSIS — R51 Headache: Secondary | ICD-10-CM

## 2017-01-20 DIAGNOSIS — I2510 Atherosclerotic heart disease of native coronary artery without angina pectoris: Secondary | ICD-10-CM | POA: Insufficient documentation

## 2017-01-20 DIAGNOSIS — Y999 Unspecified external cause status: Secondary | ICD-10-CM | POA: Insufficient documentation

## 2017-01-20 DIAGNOSIS — Y939 Activity, unspecified: Secondary | ICD-10-CM | POA: Insufficient documentation

## 2017-01-20 DIAGNOSIS — Z79899 Other long term (current) drug therapy: Secondary | ICD-10-CM | POA: Insufficient documentation

## 2017-01-20 DIAGNOSIS — Z87891 Personal history of nicotine dependence: Secondary | ICD-10-CM | POA: Insufficient documentation

## 2017-01-20 DIAGNOSIS — S069X9D Unspecified intracranial injury with loss of consciousness of unspecified duration, subsequent encounter: Secondary | ICD-10-CM

## 2017-01-20 DIAGNOSIS — S06309D Unspecified focal traumatic brain injury with loss of consciousness of unspecified duration, subsequent encounter: Secondary | ICD-10-CM | POA: Insufficient documentation

## 2017-01-20 DIAGNOSIS — I629 Nontraumatic intracranial hemorrhage, unspecified: Secondary | ICD-10-CM

## 2017-01-20 DIAGNOSIS — R519 Headache, unspecified: Secondary | ICD-10-CM

## 2017-01-20 DIAGNOSIS — X58XXXA Exposure to other specified factors, initial encounter: Secondary | ICD-10-CM | POA: Insufficient documentation

## 2017-01-20 DIAGNOSIS — S06339A Contusion and laceration of cerebrum, unspecified, with loss of consciousness of unspecified duration, initial encounter: Secondary | ICD-10-CM

## 2017-01-20 DIAGNOSIS — Y929 Unspecified place or not applicable: Secondary | ICD-10-CM | POA: Insufficient documentation

## 2017-01-20 MED ORDER — TRAMADOL HCL 50 MG PO TABS: 50.0000 mg | ORAL_TABLET | Freq: Four times a day (QID) | ORAL | 0 refills | Status: DC | PRN

## 2017-01-20 MED ORDER — ACETAMINOPHEN-CODEINE #3 300-30 MG PO TABS: 1.0000 | ORAL_TABLET | ORAL | 0 refills | Status: AC | PRN

## 2017-01-20 NOTE — Progress Notes (Signed)
Subjective:  Patient ID: Shawn Rosales, male    DOB: June 04, 1971  Age: 46 y.o. MRN: [id number]  CC: headache  HPI Shawn Rosales is a 46 y.o. male with a PMH of Alcoholism and MI presents with a headache. Was drinking last Friday. Woke up the next day bloodied from the right hand and with a headache. Was taken to the ED in [place] and had a CT done that identified fracture with intracranial hemorrhage. Headache persisted and he went to the ED at [facility] on 01/14/17. Found to have a calvarium fracture with intracranial bleed. Assessed as stable. Prescribed Tylenol, Zofran for nausea. Advised to go to neurosurgery and PCP.  Has taken OTC headache medicines with temporary relief but HA persists. He is accompanied by his niece. She states that patient's cognition seems to be slightly impaired. Pt says his right eye vision is blurry. Patient denies any other focal neurological symptom. Does not endorse any other symptoms.     Outpatient Medications Prior to Visit  Medication Sig Dispense Refill  . folic acid (FOLVITE) 1 MG tablet Take 1 tablet (1 mg total) by mouth daily. 30 tablet 0  . levETIRAcetam (KEPPRA) 500 MG tablet Take 1 tablet (500 mg total) by mouth 2 (two) times daily. 60 tablet 0  . ondansetron (ZOFRAN) 4 MG tablet Take 1 tablet (4 mg total) by mouth every 6 (six) hours as needed for nausea. 30 tablet 0  . promethazine (PHENERGAN) 25 MG tablet Take 1 tablet (25 mg total) by mouth every 6 (six) hours as needed for nausea or vomiting. 25 tablet 0  . thiamine 100 MG tablet Take 1 tablet (100 mg total) by mouth daily. 30 tablet 0  . acetaminophen (TYLENOL) 325 MG tablet Take 2 tablets (650 mg total) by mouth every 6 (six) hours as needed for mild pain, fever or headache. 30 tablet 0   No facility-administered medications prior to visit.      ROS Review of Systems  Constitutional: Negative for chills, fever and malaise/fatigue.  Eyes: Positive for blurred vision.  Respiratory: Negative  for shortness of breath.   Cardiovascular: Negative for chest pain and palpitations.  Gastrointestinal: Negative for abdominal pain and nausea.  Genitourinary: Negative for dysuria and hematuria.  Musculoskeletal: Negative for joint pain and myalgias.  Skin: Negative for rash.  Neurological: Positive for headaches. Negative for tingling, tremors, sensory change, speech change and focal weakness.  Psychiatric/Behavioral: Negative for depression. The patient is not nervous/anxious.     Objective:  BP 116/80 (BP Location: Right Arm, Patient Position: Sitting, Cuff Size: Normal)   Pulse 92   Temp 98.4 F (36.9 C) (Oral)   Ht 5' 2.5" (1.588 m)   Wt 155 lb 6.4 oz (70.5 kg)   SpO2 98%   BMI 27.97 kg/m   BP/Weight 01/20/2017 01/17/2017 01/15/2017  Systolic BP 116 131 -  Diastolic BP 80 73 -  Wt. (Lbs) 155.4 - 154.4  BMI 27.97 - -      Physical Exam  Constitutional: He is oriented to person, place, and time.  Well developed, well nourished, NAD, polite  HENT:  Head: Normocephalic and atraumatic.  Well healed and remote scars on right parietal aspect of scalp at the affected area.   Eyes: No scleral icterus.  Neck: Normal range of motion.  Cardiovascular: Normal rate, regular rhythm and normal heart sounds.   Pulmonary/Chest: Effort normal and breath sounds normal.  Musculoskeletal: He exhibits no edema.  Neurological: He is alert and oriented to person,  place, and time. He has normal reflexes. No cranial nerve deficit. Coordination normal.  Skin: Skin is warm and dry. No rash noted. No erythema. No pallor.  Psychiatric: He has a normal mood and affect. His behavior is normal. Thought content normal.  Vitals reviewed.    Assessment & Plan:   1. Intracranial hemorrhage (HCC) - I ordered CT [facility]  Earliest available CT head is on Monday at 0815 in the morning. Therefore, I advised patient to go to the ED for a new CT scan  of the head. Pt stated he will go immediately after this encounter. - acetaminophen-codeine (TYLENOL #3) 300-30 MG tablet; Take 1 tablet by mouth every 4 (four) hours as needed for moderate pain.  Dispense: 42 tablet; Refill: 0 - CBC with Differential - Comprehensive metabolic panel - Ambulatory referral to Neurology  2. Acute nonintractable headache, unspecified headache type - CT Head Wo Contrast; Future could not be scheduled until Monday. See above #1. - acetaminophen-codeine (TYLENOL #3) 300-30 MG tablet; Take 1 tablet by mouth every 4 (four) hours as needed for moderate pain.  Dispense: 42 tablet; Refill: 0 - CBC with Differential - Comprehensive metabolic panel - Ambulatory referral to Neurology   Meds ordered this encounter  Medications  . acetaminophen-codeine (TYLENOL #3) 300-30 MG tablet    Sig: Take 1 tablet by mouth every 4 (four) hours as needed for moderate pain.    Dispense:  42 tablet    Refill:  0    Order Specific Question:   Supervising Provider    Answer:   Quentin Angst [id number]    Follow-up: Return if symptoms worsen or fail to improve.   Loletta Specter PA

## 2017-01-20 NOTE — ED Notes (Signed)
Patient transported to CT 

## 2017-01-20 NOTE — ED Provider Notes (Signed)
[facility]-EMERGENCY DEPT Provider Note   CSN: [id number] Arrival date & time: 01/20/17  1056     History   Chief Complaint Chief Complaint  Patient presents with  . Headache    HPI Shawn Rosales is a 46 y.o. male.  HPI Pt comes in to the ER for headaches and evaluation. Pt was seen in the ER recently, and admitted for TBI. Pt was discharged with follow up and an outpatient CT was requested. The treating team was not able to coordinate a CT scan, so he was advised to come to the ER. Pt has persistent, intermittent headache. He denies any seizures, new numbness, tingling, weakness, balance issues, vision complains.  Past Medical History:  Diagnosis Date  . Alcoholism (HCC)   . Myocardial infarction [facility])     Patient Active Problem List   Diagnosis Date Noted  . Intracranial hemorrhage (HCC) 01/15/2017  . Alcoholism (HCC) 01/15/2017    History reviewed. No pertinent surgical history.     Home Medications    Prior to Admission medications   Medication Sig Start Date End Date Taking? Authorizing Provider  acetaminophen-codeine (TYLENOL #3) 300-30 MG tablet Take 1 tablet by mouth every 4 (four) hours as needed for moderate pain. 01/20/17 01/27/17  Loletta Specter, PA-C  folic acid (FOLVITE) 1 MG tablet Take 1 tablet (1 mg total) by mouth daily. 01/18/17   Leroy Sea, MD  levETIRAcetam (KEPPRA) 500 MG tablet Take 1 tablet (500 mg total) by mouth 2 (two) times daily. 01/17/17   Leroy Sea, MD  ondansetron (ZOFRAN) 4 MG tablet Take 1 tablet (4 mg total) by mouth every 6 (six) hours as needed for nausea. 01/17/17   Leroy Sea, MD  promethazine (PHENERGAN) 25 MG tablet Take 1 tablet (25 mg total) by mouth every 6 (six) hours as needed for nausea or vomiting. 01/17/17   Leroy Sea, MD  thiamine 100 MG tablet Take 1 tablet (100 mg total) by mouth daily. 01/18/17   Leroy Sea, MD    Family History Family History  Problem Relation Age of Onset  . Diabetes  Father   . Alcohol abuse Father   . Diabetes Sister     Social History Social History  Substance Use Topics  . Smoking status: Former Games developer  . Smokeless tobacco: Never Used  . Alcohol use Yes     Comment: drinks every day per daughter      Allergies   Patient has no known allergies.   Review of Systems Review of Systems  Constitutional: Negative for activity change.  Neurological: Positive for headaches.  All other systems reviewed and are negative.    Physical Exam Updated Vital Signs BP 120/77 (BP Location: Left Arm)   Pulse 90   Temp 99.2 F (37.3 C) (Oral)   Resp 18   SpO2 100%   Physical Exam  Constitutional: He is oriented to person, place, and time. He appears well-developed.  HENT:  Head: Normocephalic and atraumatic.  Eyes: Conjunctivae and EOM are normal. Pupils are equal, round, and reactive to light.  Neck: Normal range of motion. Neck supple.  Cardiovascular: Normal rate and regular rhythm.   Pulmonary/Chest: Effort normal and breath sounds normal.  Abdominal: Soft. Bowel sounds are normal. He exhibits no distension and no mass. There is no tenderness. There is no rebound and no guarding.  Musculoskeletal: He exhibits no deformity.  Neurological: He is alert and oriented to person, place, and time. No cranial nerve deficit. Coordination  normal.  Skin: Skin is warm.  Nursing note and vitals reviewed.    ED Treatments / Results  Labs (all labs ordered are listed, but only abnormal results are displayed) Labs Reviewed - No data to display  EKG  EKG Interpretation None       Radiology No results found.  Procedures Procedures (including critical care time)  Medications Ordered in ED Medications - No data to display   Initial Impression / Assessment and Plan / ED Course  I have reviewed the triage vital signs and the nursing notes.  Pertinent labs & imaging results that were available during my care of the patient were reviewed by  me and considered in my medical decision making (see chart for details).     Pt comes to the ER essentially for a repeat CT scan. He has persistent headaches, but no other focal neuro complains and the exam is unremarkable. Ct ordered. Pt to continue outpatient f/u thereafter.  Final Clinical Impressions(s) / ED Diagnoses   Final diagnoses:  Traumatic brain injury with loss of consciousness, subsequent encounter    New Prescriptions New Prescriptions   No medications on file     Derwood Kaplan, MD 01/20/17 1204

## 2017-01-20 NOTE — ED Triage Notes (Signed)
Pt states he was sent here from pcp for c/o of headache, pt had head bleed on 4/24 and went to pcp today for follow up, was sent here for CT scan. pts discharge instructions show repeat ct scan at [facility] due on 4/30. Pt ambulatory to triage, speech clear, no neuro deficits noted.

## 2017-01-20 NOTE — ED Notes (Signed)
ED Provider at bedside. 

## 2017-01-20 NOTE — Patient Instructions (Addendum)
GO TO EMERGENCY DEPARTMENT. WE COULD NOT SCHEDULE YOU A CT SCAN FOR TODAY. IT IS IMPORTANT THAT YOU GO FOR ANOTHER CT OF THE HEAD TO LOOK FOR ANY NEW BLEEDING.  Hemorragia intracraneal (Intracranial Hemorrhage) La hemorragia intracraneal es el sangrado que ocurre en las capas que se encuentran entre el crneo y el cerebro. Se produce la rotura de un vaso sanguneo y hay un derrame de sangre dentro de la cavidad craneal. Luego, el derrame de sangre se acumula (hematoma). Esto genera presin y daa las clulas cerebrales. La hemorragia puede ser leve o grave. Cuando los casos son graves, puede producirse un dao permanente o la muerte. Los sntomas pueden aparecer de [employer] repentina o manifestarse con el paso del [place]. El diagnstico y el tratamiento tempranos permiten una mejor recuperacin. Hay cuatro tipos de hemorragias intracraneales: subaracnoidea, subdural, extradural o cerebral. CAUSAS  Lesin en la cabeza (traumatismo).  Ruptura de aneurisma cerebral.  Hemorragia de vasos sanguneos que se desarrollan de forma anmala (malformacin arteriovenosa).  Trastornos hemorrgicos.  Uso de anticoagulantes.  Uso de ciertas drogas, como la cocana. En algunas personas con hemorragia intracraneal, la causa es desconocida. FACTORES DE RIESGO  Consumo de productos que contienen tabaco, como cigarrillos y tabaco de Theatre manager.  Presin arterial elevada (hipertensin arterial).  Abusar del alcohol.  Ser mujer, especialmente en edad posmenopusica.  Tener antecedentes familiares de enfermedad de los vasos sanguneos del cerebro (enfermedad cerebrovascular).  Tener ciertos sndromes genticos que producen enfermedades renales o del tejido Designer, fashion/clothing. SIGNOS Y SNTOMAS  Dolor de cabeza sbito e intenso que no tiene causa aparente. Dolor de cabeza que generalmente se describe Curator de cabeza que haya sufrido.  Nuseas o vmitos, especialmente si se [place] con otros sntomas, como  dolor de [place].  Debilidad o adormecimiento sbito de la cara, el brazo o la pierna, especialmente en un lado del cuerpo.  Dificultad repentina para caminar o para mover los brazos o las piernas.  Confusin sbita.  Cambios de personalidad sbitos.  Dificultad para hablar (afasia) o comprender.  Dificultad para tragar.  Dificultad sbita en la visin de uno o ambos ojos.  Visin doble.  Mareos.  Prdida del equilibrio o de la coordinacin.  Intolerancia a Statistician.  Rigidez en el cuello. DIAGNSTICO El mdico le preguntar acerca de sus sntomas y le har un examen fsico. Si se sospecha una hemorragia intracraneal, se pueden indicar varias pruebas. Estos estudios pueden incluir:  Una tomografa computarizada.  Una resonancia magntica (RM).  Un angiograma cerebral.  Una puncin espinal (puncin lumbar).  Anlisis de [place]. TRATAMIENTO Se requiere un tratamiento inmediato en el hospital para reducir el riesgo de dao cerebral. El tratamiento depender de la causa de la hemorragia, dnde se encuentra, y el grado de hemorragia y de [place]. Los [employer] del tratamiento incluyen detener la hemorragia, reparar la causa, aliviar los sntomas y prevenir los [place].  Se pueden administrar medicamentos para:  Bajar la presin arterial (antihipertensivos).  Aliviar el dolor (analgsicos).  Aliviar las nuseas o los vmitos.  Se puede necesitar una ciruga para Comptroller, reparar la causa que la provoca o extraer la [place].  La rehabilitacin puede ser necesaria para mejorar funciones cognitivas y cotidianas, daadas por la enfermedad. Los tratamientos suplementarios dependen de la duracin, la gravedad y la causa de los sntomas. Los fisioterapeutas, los terapeutas del habla y los terapeutas ocupacionales lo evaluarn y trabajarn para mejorar las funciones daadas por la hemorragia intracraneal. Se tomarn medidas para prevenir problemas  a corto y Air cabin crew,  que incluyen infecciones por Education administrator extrao a los pulmones (neumona por aspiracin), cogulos sanguneos en las piernas, escaras y cadas. INSTRUCCIONES PARA EL CUIDADO EN EL HOGAR  Tome los medicamentos solamente como se lo haya indicado el mdico.  Coma alimentos sanos como se lo haya indicado el mdico.  Para controlar la hipertensin arterial, se recomienda una dieta con bajo contenido de sal (sodio), grasas saturadas y trans, y colesterol.  Los alimentos deben estar blandos o hechos pur, o los bocados deben ser pequeos para no aspirarse ni ahogarse.  Si los estudios [employer] su capacidad para tragar de Wellsite geologist segura se ha visto afectada, tal vez deba buscar la ayuda de los especialistas, como un nutricionista, un patlogo del habla y del lenguaje o un terapeuta ocupacional. Estos profesionales pueden ensearle a obtener la nutricin que el organismo necesita sin correr Surveyor, minerals.  Descanse y limite las actividades o movimientos, segn las indicaciones del mdico.  No consuma ningn producto que contenga tabaco, lo que incluye cigarrillos, tabaco de Theatre manager o Administrator, Civil Service. Si necesita ayuda para dejar de fumar, consulte al mdico.  Limite el consumo de alcohol a no ms de 1 medida por da si es mujer y no est Orthoptist, y 2 medidas si es hombres. Una medida equivale a 12onzas de cerveza, 5onzas de vino o 1onzas de bebidas alcohlicas de alta graduacin.  Haga otros cambios en su estilo de vida segn las recomendaciones del mdico.  Debe controlar su presin arterial segn las indicaciones.  Un ambiente seguro en el hogar es importante para reducir el riesgo de cadas. El mdico podr disponer que los especialistas evalen su hogar. Tambin es Programme researcher, broadcasting/film/video barras para sostn en la habitacin y el bao. El Firefighter un equipo especial para Musician, como un inodoro elevado o un asiento para la ducha.  Haga fisioterapia, terapia  ocupacional y terapia del habla como se lo haya indicado el mdico. Tal vez sea necesario realizar un tratamiento continuo para optimizar la recuperacin.  Use un andador o un bastn en todo momento, si se lo indic el mdico.  Concurra a todas las visitas de control con el mdico y otros especialistas. Esto es importante. Estas incluyen derivaciones, fisioterapia y rehabilitacin. SOLICITE ATENCIN MDICA DE INMEDIATO SI:  Siente sbitamente un dolor de cabeza intenso que no tiene causa aparente.  Tiene nuseas o vmitos con otro sntoma.  Siente debilidad o adormecimiento sbito de la cara, el brazo o la pierna, especialmente en un lado del cuerpo.  Tiene una dificultad repentina para caminar o para mover los brazos o las piernas.  Se siente repentinamente confundido.  Tiene dificultad para hablar (afasia) o comprender.  Presenta, sbitamente, dificultad para ver de uno o ambos ojos.  Sbitamente, pierde el equilibrio o la coordinacin.  Presenta rigidez en el cuello.  Tiene dificultad para respirar.  Sufre la prdida parcial o total de la conciencia. Estos sntomas pueden representar un problema grave y ser Radio broadcast assistant. No espere hasta que los sntomas desaparezcan. Solicite atencin mdica de inmediato. Comunquese con el servicio de emergencias de su localidad (911 en los [place]). No conduzca por sus propios medios [employer]. Esta informacin no tiene Theme park manager el consejo del mdico. Asegrese de hacerle al mdico cualquier pregunta que tenga. Document Released: 04/09/2014 Document Revised: 01/04/2016 Document Reviewed: 11/06/2013 Elsevier Interactive Patient Education  2017 [employer].

## 2017-01-23 ENCOUNTER — Ambulatory Visit (HOSPITAL_COMMUNITY): Payer: MEDICAID

## 2017-01-30 ENCOUNTER — Ambulatory Visit (INDEPENDENT_AMBULATORY_CARE_PROVIDER_SITE_OTHER): Payer: Self-pay | Admitting: Physician Assistant

## 2017-01-30 ENCOUNTER — Encounter (INDEPENDENT_AMBULATORY_CARE_PROVIDER_SITE_OTHER): Payer: Self-pay | Admitting: Physician Assistant

## 2017-01-30 VITALS — BP 115/71 | HR 87 | Temp 97.8°F | Ht 63.0 in | Wt 161.8 lb

## 2017-01-30 DIAGNOSIS — S0291XA Unspecified fracture of skull, initial encounter for closed fracture: Secondary | ICD-10-CM

## 2017-01-30 DIAGNOSIS — R51 Headache: Secondary | ICD-10-CM

## 2017-01-30 DIAGNOSIS — J301 Allergic rhinitis due to pollen: Secondary | ICD-10-CM

## 2017-01-30 DIAGNOSIS — R519 Headache, unspecified: Secondary | ICD-10-CM

## 2017-01-30 MED ORDER — ACETAMINOPHEN 500 MG PO TABS: 1000.0000 mg | ORAL_TABLET | Freq: Three times a day (TID) | ORAL | 0 refills | Status: AC | PRN

## 2017-01-30 MED ORDER — LEVOCETIRIZINE DIHYDROCHLORIDE 5 MG PO TABS: 5.0000 mg | ORAL_TABLET | Freq: Every evening | ORAL | 0 refills | Status: AC

## 2017-01-30 NOTE — Patient Instructions (Signed)
Rinitis alrgica (Allergic Rhinitis) La rinitis alrgica ocurre cuando las membranas mucosas de la nariz responden a los alrgenos. Los alrgenos son las partculas que estn en el aire y que hacen que el cuerpo tenga una reaccin alrgica. Esto hace que usted libere anticuerpos alrgicos. A travs de una cadena de eventos, estos finalmente hacen que usted libere histamina en la corriente sangunea. Aunque la funcin de la histamina es proteger al organismo, es esta liberacin de histamina lo que provoca malestar, como los estornudos frecuentes, la congestin y goteo y picazn nasales. CAUSAS La causa de la rinitis alrgica estacional (fiebre del heno) son los alrgenos del polen que pueden provenir del csped, los rboles y la maleza. La causa de la rinitis alrgica permanente (rinitis alrgica perenne) son los alrgenos, como los caros del polvo domstico, la caspa de las mascotas y las esporas del moho. SNTOMAS  Secrecin nasal (congestin).  Goteo y picazn nasales con estornudos y lagrimeo. DIAGNSTICO Su mdico puede ayudarlo a determinar el alrgeno o los alrgenos que desencadenan sus sntomas. Si usted y su mdico no pueden determinar cul es el alrgeno, pueden hacerse anlisis de sangre o estudios de la piel. El mdico diagnosticar la afeccin despus de hacerle una historia clnica y un examen fsico. Adems, puede evaluarlo para detectar la presencia de otras enfermedades afines, como asma, conjuntivitis u otitis. TRATAMIENTO La rinitis alrgica no tiene cura, pero puede controlarse con lo siguiente:  Medicamentos que inhiben los sntomas de alergia, por ejemplo, vacunas contra la alergia, aerosoles nasales y antihistamnicos por va oral.  Evitar el alrgeno. La fiebre del heno a menudo puede tratarse con antihistamnicos en las formas de pldoras o aerosol nasal. Los antihistamnicos bloquean los efectos de la histamina. Existen medicamentos de venta libre que pueden ayudar con la  congestin nasal y la hinchazn alrededor de los ojos. Consulte a su mdico antes de tomar o administrarse este medicamento. Si la prevencin del alrgeno o el medicamento recetado no dan resultado, existen muchos medicamentos nuevos que su mdico puede recetarle. Pueden usarse medicamentos ms fuertes si las medidas iniciales no son efectivas. Pueden aplicarse inyecciones desensibilizantes si los medicamentos y la prevencin no funcionan. La desensibilizacin ocurre cuando un paciente recibe vacunas constantes hasta que el cuerpo se vuelve menos sensible al alrgeno. Asegrese de realizar un seguimiento con su mdico si los problemas continan. INSTRUCCIONES PARA EL CUIDADO EN EL HOGAR No es posible evitar por completo los alrgenos, pero puede reducir los sntomas al tomar medidas para limitar su exposicin a ellos. Es muy til saber exactamente a qu es alrgico para que pueda evitar sus desencadenantes especficos. SOLICITE ATENCIN MDICA SI:  Tiene fiebre.  Desarrolla una tos que no cesa fcilmente (persistente).  Le falta el aire.  Comienza a tener sibilancias.  Los sntomas interfieren con las actividades diarias normales. Esta informacin no tiene como fin reemplazar el consejo del mdico. Asegrese de hacerle al mdico cualquier pregunta que tenga. Document Released: 06/22/2005 Document Revised: 10/03/2014 Document Reviewed: 05/20/2013 Elsevier Interactive Patient Education  2017 Elsevier Inc.  

## 2017-01-30 NOTE — Progress Notes (Signed)
Subjective:  Patient ID: Roxanne Orner, male    DOB: 1971/06/05  Age: 46 y.o. MRN: [id number]  CC: headache  HPI Clemens Lachman is a 46 y.o. male presents on f/u of intracranial hemorrhage and headache. He was sent to [facility] for a CT at the previous visit due to persistent HA. CT on 01/20/17 showed the following:    1. Regression of the anterior and inferior left frontal lobe hemorrhage since 01/15/2017 favored to be posttraumatic hemorrhagic contusion. Residual edema and/or developing encephalomalacia without mass effect. 2. A nondisplaced oblique fracture across the right lateral calvarium has become evident; was very subtle on 01/14/2017. Associated small volume right subdural hematoma has resolved except for possible small parafalcine component of subdural blood. 3. No intracranial mass effect or new intracranial abnormality.  Says the headache is better overall since last visit. Says Tylenol #3 has helped and asks for a refill. Has not been able to get appointment for neurology yet but thinks they have called him twice. He was unable to understand English and therefore did not make appointment. Denies any focal neurological symptoms. Does not endorse f/c/n/v, neck pain, back pain, SOB, CP, or GI/GU sxs.   Outpatient Medications Prior to Visit  Medication Sig Dispense Refill  . folic acid (FOLVITE) 1 MG tablet Take 1 tablet (1 mg total) by mouth daily. 30 tablet 0  . levETIRAcetam (KEPPRA) 500 MG tablet Take 1 tablet (500 mg total) by mouth 2 (two) times daily. (Patient not taking: Reported on 01/20/2017) 60 tablet 0  . thiamine 100 MG tablet Take 1 tablet (100 mg total) by mouth daily. 30 tablet 0  . traMADol (ULTRAM) 50 MG tablet Take 1 tablet (50 mg total) by mouth every 6 (six) hours as needed. 15 tablet 0  . ondansetron (ZOFRAN) 4 MG tablet Take 1 tablet (4 mg total) by mouth every 6 (six) hours as needed for nausea. (Patient not taking: Reported on 01/20/2017) 30 tablet 0  . promethazine  (PHENERGAN) 25 MG tablet Take 1 tablet (25 mg total) by mouth every 6 (six) hours as needed for nausea or vomiting. (Patient not taking: Reported on 01/20/2017) 25 tablet 0   No facility-administered medications prior to visit.      ROS Review of Systems  Constitutional: Negative for chills, fever and malaise/fatigue.  HENT: Positive for congestion.   Eyes: Negative for blurred vision.  Respiratory: Negative for shortness of breath.   Cardiovascular: Negative for chest pain and palpitations.  Gastrointestinal: Negative for abdominal pain and nausea.  Genitourinary: Negative for dysuria and hematuria.  Musculoskeletal: Negative for joint pain and myalgias.  Skin: Negative for rash.  Neurological: Positive for headaches. Negative for tingling.  Psychiatric/Behavioral: Negative for depression. The patient is not nervous/anxious.     Objective:  BP 115/71 (BP Location: Left Arm, Patient Position: Sitting, Cuff Size: Normal)   Pulse 87   Temp 97.8 F (36.6 C) (Oral)   Ht 5\' 3"  (1.6 m)   Wt 161 lb 12.8 oz (73.4 kg)   SpO2 97%   BMI 28.66 kg/m   BP/Weight 01/30/2017 01/20/2017 01/20/2017  Systolic BP 115 125 116  Diastolic BP 71 79 80  Wt. (Lbs) 161.8 - 155.4  BMI 28.66 - 27.97      Physical Exam  Constitutional: He is oriented to person, place, and time.  Well developed, well nourished, NAD, polite  HENT:  Head: Normocephalic.  Two well healed scars in the area of pain. Mildly tender to palpation over the scarring on  right side of head.  Eyes: No scleral icterus.  Neck: Normal range of motion. Neck supple. No thyromegaly present.  Cardiovascular: Normal rate, regular rhythm and normal heart sounds.   Pulmonary/Chest: Effort normal and breath sounds normal.  Abdominal: Bowel sounds are normal.  Musculoskeletal: He exhibits no edema.  Neurological: He is alert and oriented to person, place, and time.  Skin: Skin is warm and dry. No rash noted. No erythema. No pallor.   Psychiatric: He has a normal mood and affect. His behavior is normal. Thought content normal.  Vitals reviewed.    Assessment & Plan:   1. Closed fracture of skull, unspecified bone, initial encounter (HCC) - Begin acetaminophen (TYLENOL) 500 MG tablet; Take 2 tablets (1,000 mg total) by mouth every 8 (eight) hours as needed.  Dispense: 21 tablet; Refill: 0  2. Seasonal allergic rhinitis due to pollen - Begin levocetirizine (XYZAL) 5 MG tablet; Take 1 tablet (5 mg total) by mouth every evening.  Dispense: 30 tablet; Refill: 0  3. Headache - Seems to be resolving. - I advised pt to call neurology and ask for a spanish interpreter in order to make his appointment.    Meds ordered this encounter  Medications  . acetaminophen (TYLENOL) 500 MG tablet    Sig: Take 2 tablets (1,000 mg total) by mouth every 8 (eight) hours as needed.    Dispense:  21 tablet    Refill:  0    Order Specific Question:   Supervising Provider    Answer:   Quentin AngstJEGEDE, OLUGBEMIGA E [id number]  . levocetirizine (XYZAL) 5 MG tablet    Sig: Take 1 tablet (5 mg total) by mouth every evening.    Dispense:  30 tablet    Refill:  0    Order Specific Question:   Supervising Provider    Answer:   Quentin AngstJEGEDE, OLUGBEMIGA E [id number]    Follow-up: Return in about 4 weeks (around 02/27/2017).   Loletta Specteroger David Gomez PA

## 2017-02-01 ENCOUNTER — Ambulatory Visit (INDEPENDENT_AMBULATORY_CARE_PROVIDER_SITE_OTHER): Payer: Self-pay | Admitting: Physician Assistant

## 2017-02-10 ENCOUNTER — Encounter: Payer: Self-pay | Admitting: Student

## 2017-02-16 ENCOUNTER — Encounter: Payer: Self-pay | Admitting: Neurology

## 2017-02-16 ENCOUNTER — Ambulatory Visit (INDEPENDENT_AMBULATORY_CARE_PROVIDER_SITE_OTHER): Payer: Self-pay | Admitting: Neurology

## 2017-02-16 ENCOUNTER — Encounter (INDEPENDENT_AMBULATORY_CARE_PROVIDER_SITE_OTHER): Payer: Self-pay

## 2017-02-16 VITALS — BP 134/78 | HR 73 | Ht 63.0 in | Wt 162.5 lb

## 2017-02-16 DIAGNOSIS — I629 Nontraumatic intracranial hemorrhage, unspecified: Secondary | ICD-10-CM

## 2017-02-16 DIAGNOSIS — G40909 Epilepsy, unspecified, not intractable, without status epilepticus: Secondary | ICD-10-CM | POA: Insufficient documentation

## 2017-02-16 MED ORDER — LEVETIRACETAM 500 MG PO TABS: 500.0000 mg | ORAL_TABLET | Freq: Two times a day (BID) | ORAL | 4 refills | Status: AC

## 2017-02-16 NOTE — Patient Instructions (Signed)
No driving until seizure free for 6 months, he should also avoid mechanical operation, ladder climbing, or similar activities that can potentially put himself into danger if he had recurrent seizure.  He reported last seizure was in April 2018 that is associated with binge alcohol use.
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
## 2017-03-04 ENCOUNTER — Other Ambulatory Visit: Payer: Self-pay

## 2017-03-16 ENCOUNTER — Telehealth: Payer: Self-pay | Admitting: *Deleted

## 2017-03-16 ENCOUNTER — Ambulatory Visit (INDEPENDENT_AMBULATORY_CARE_PROVIDER_SITE_OTHER): Payer: Self-pay

## 2017-03-16 DIAGNOSIS — G40909 Epilepsy, unspecified, not intractable, without status epilepticus: Secondary | ICD-10-CM

## 2017-03-16 DIAGNOSIS — I629 Nontraumatic intracranial hemorrhage, unspecified: Secondary | ICD-10-CM

## 2017-03-16 NOTE — Telephone Encounter (Signed)
Called [facility]/ [facility] - they contacted the patient and provided the normal EEG results.

## 2017-03-16 NOTE — Procedures (Signed)
    History:  Shawn Rosales is a 46 year old gentleman with a recent hospitalization for an intracranial hemorrhage. The patient does have a history of seizure-type events, he has a history of binge drinking. The patient was noted to be confused with an unsteady gait and a headache in April 2018. He is being evaluated for seizures.  This is a routine EEG. No skull defects are noted. Medications include folic acid, Keppra, and Xyzal.   EEG classification: Normal awake  Description of the recording: The background rhythms of this recording consists of a fairly well modulated medium amplitude alpha rhythm of 9 Hz that is reactive to eye opening and closure. As the record progresses, the patient appears to remain in the waking state throughout the recording. Photic stimulation was performed, resulting in a bilateral and symmetric photic driving response. Hyperventilation was also performed, resulting in a minimal buildup of the background rhythm activities without significant slowing seen. At no time during the recording does there appear to be evidence of spike or spike wave discharges or evidence of focal slowing. EKG monitor shows no evidence of cardiac rhythm abnormalities with a heart rate of 72.  Impression: This is a normal EEG recording in the waking state. No evidence of ictal or interictal discharges are seen.

## 2017-03-16 NOTE — Telephone Encounter (Signed)
-----   Message from York Spanielharles K Willis, MD sent at 03/16/2017  5:23 PM EDT ----- Please call the patient. The EEG study was normal.

## 2017-03-28 ENCOUNTER — Ambulatory Visit
Admission: RE | Admit: 2017-03-28 | Discharge: 2017-03-28 | Disposition: A | Discharge status: Home / Self Care | Payer: No Typology Code available for payment source | Source: Ambulatory Visit | Attending: Neurology | Admitting: Neurology

## 2017-03-28 ENCOUNTER — Other Ambulatory Visit: Payer: Self-pay

## 2017-03-28 ENCOUNTER — Telehealth (INDEPENDENT_AMBULATORY_CARE_PROVIDER_SITE_OTHER): Payer: Self-pay | Admitting: Physician Assistant

## 2017-03-28 DIAGNOSIS — I629 Nontraumatic intracranial hemorrhage, unspecified: Secondary | ICD-10-CM

## 2017-03-28 DIAGNOSIS — G40909 Epilepsy, unspecified, not intractable, without status epilepticus: Secondary | ICD-10-CM

## 2017-03-28 NOTE — Telephone Encounter (Signed)
Patient called would like to know MRI results.  Please follow up.

## 2017-03-30 NOTE — Telephone Encounter (Signed)
FWD to PCP. Shawn Rosales, CMA  

## 2017-03-30 NOTE — Telephone Encounter (Signed)
Abnormal MRI brain (without) demonstrating: 1. 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 2. No acute findings. 3. Compared to CT on 01/20/17 and 01/14/17, there has been expected reduction and improvement in the left frontal cerebral hemorrhage. Previously noted right temporal and left frontal subdural hematomas have resolved.  I have called patient and spoke to him of his results. Reports feeling well and without headache. I expect contusion found on MRI will eventually heal. I advised patient to call if there are any symptoms or concerns in the future.

## 2017-04-10 ENCOUNTER — Ambulatory Visit (INDEPENDENT_AMBULATORY_CARE_PROVIDER_SITE_OTHER): Payer: No Typology Code available for payment source | Admitting: Physician Assistant

## 2017-05-04 ENCOUNTER — Ambulatory Visit: Payer: Self-pay | Admitting: Neurology

## 2017-05-05 ENCOUNTER — Encounter: Payer: Self-pay | Admitting: Neurology

## 2017-09-29 IMAGING — CT CT HEAD W/O CM
3 series · 15 of 47 positions shown, 18 images · non-contrast
Comparison: Head CT without contrast 01/15/2017 and earlier.

CLINICAL DATA: 45-year-old male with headache and blurred vision.
Recent intracranial hemorrhage.

EXAM:
CT HEAD WITHOUT CONTRAST
TECHNIQUE: Contiguous axial images were obtained from the base of the skull
through the vertex without intravenous contrast.

[Series 2: head 5.0 h31s · axial · 0.44mm/px · z∈[+77,+217]mm · 9 of 34 slices shown, 12 images]
[im 3/34  brain]
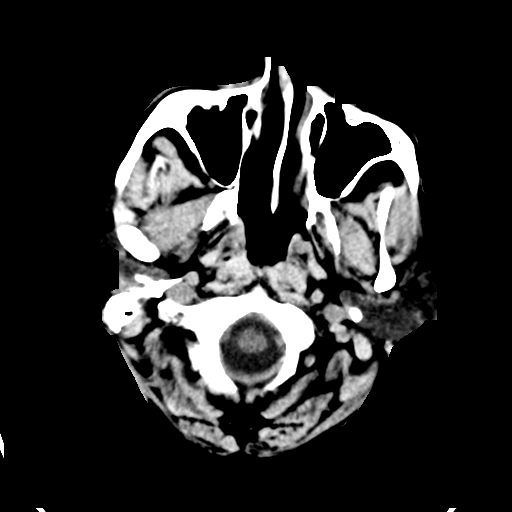
[im 3/34  bone]
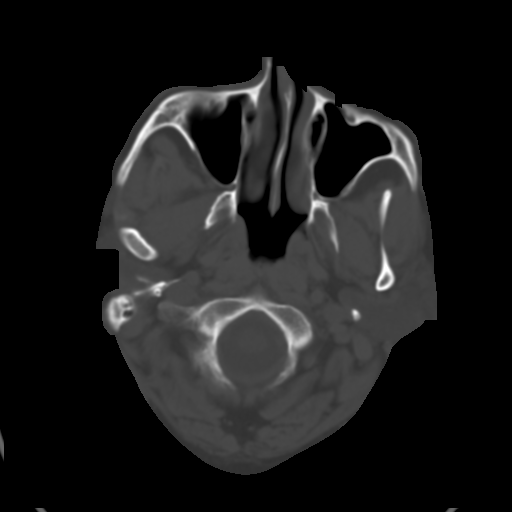
[im 6/34  brain]
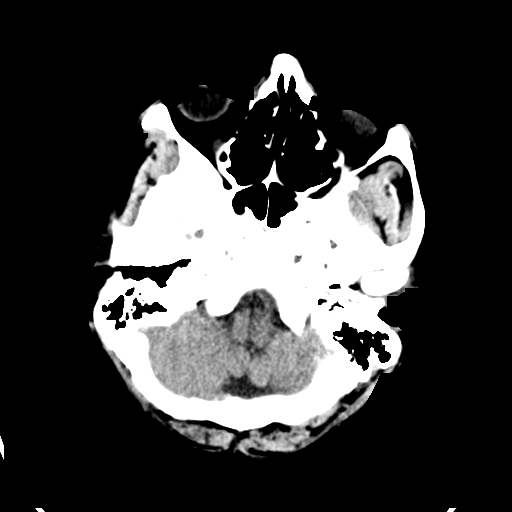
[im 10/34  brain]
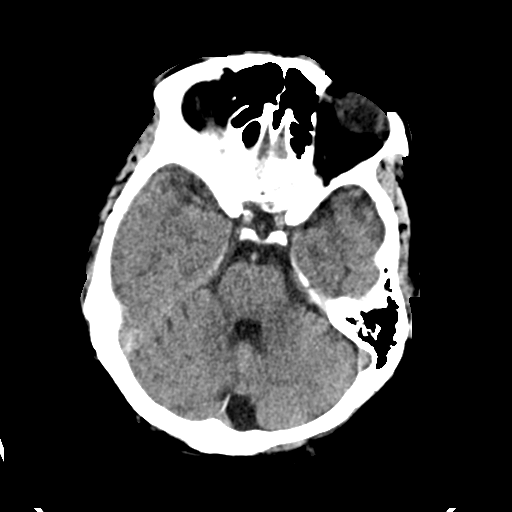
[im 13/34  brain]
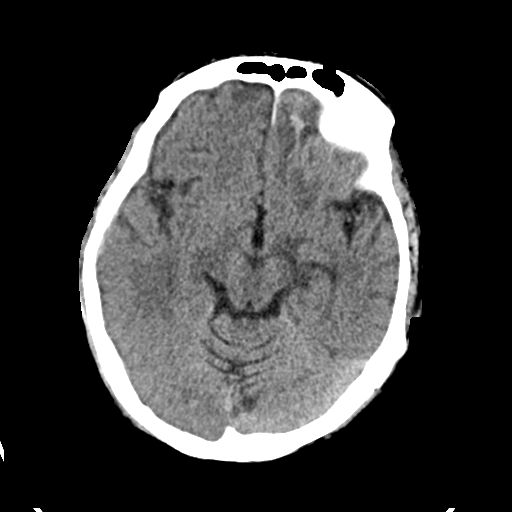
[im 18/34  brain]
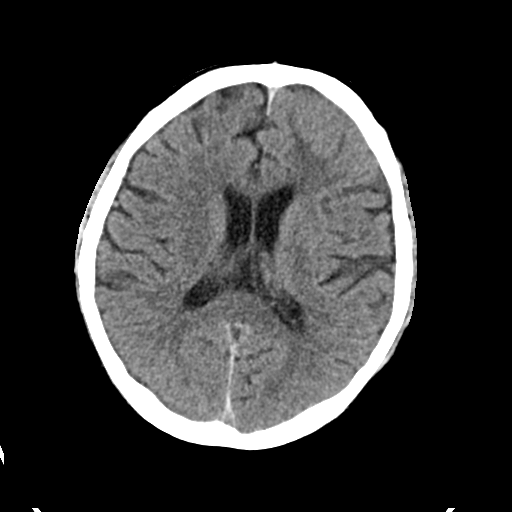
[im 18/34  bone]
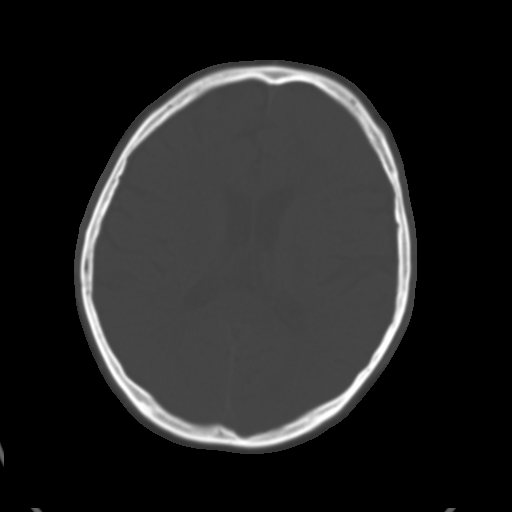
[im 21/34  brain]
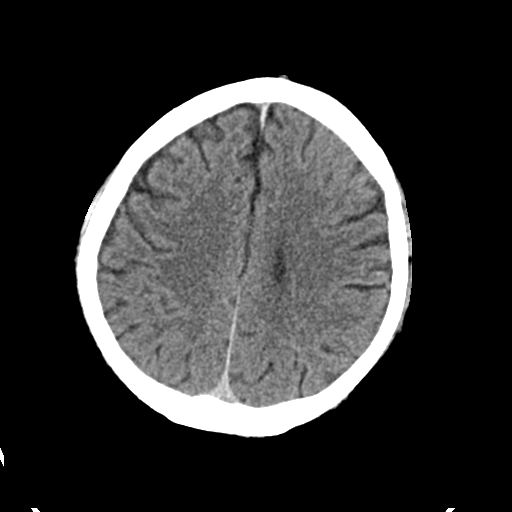
[im 24/34  brain]
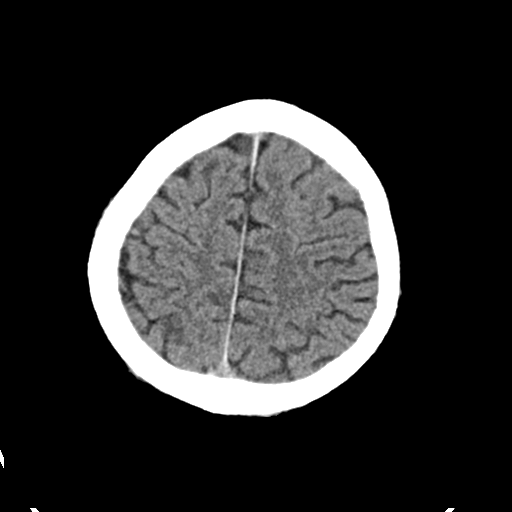
[im 28/34  brain]
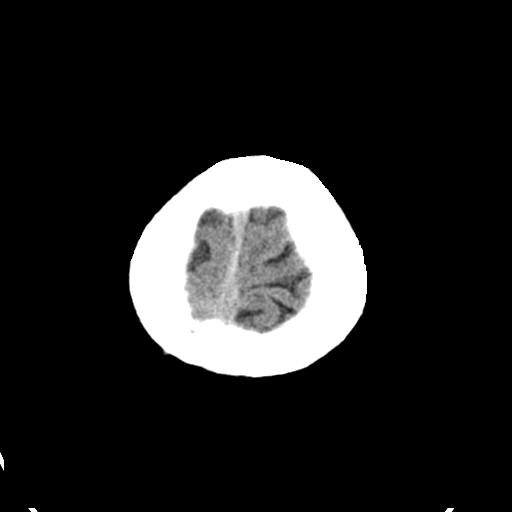
[im 31/34  brain]
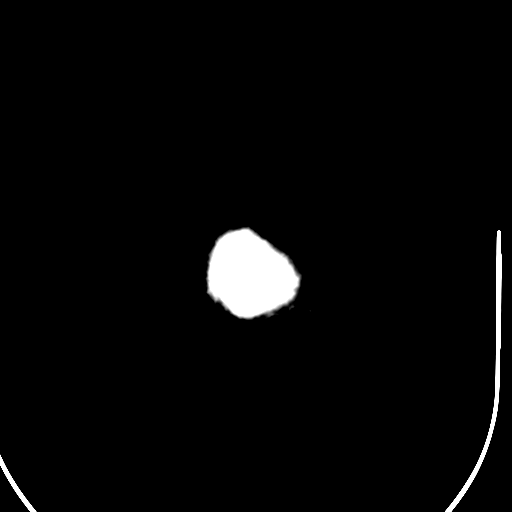
[im 31/34  bone]
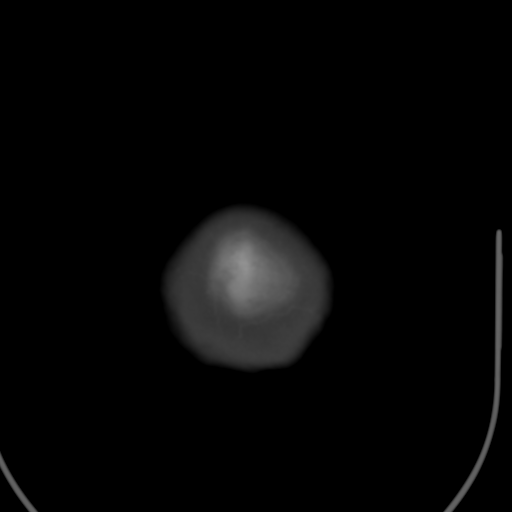

[Series 4: head 3.0 mpr cor · coronal · 0.37mm/px · 3 of 67 slices shown]
[im 23/67  brain]
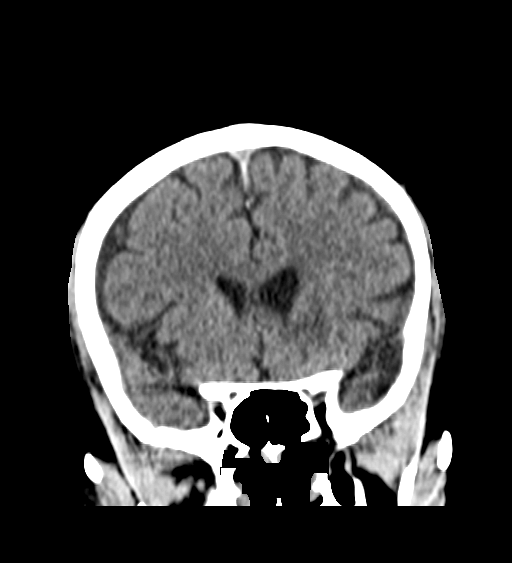
[im 30/67  brain]
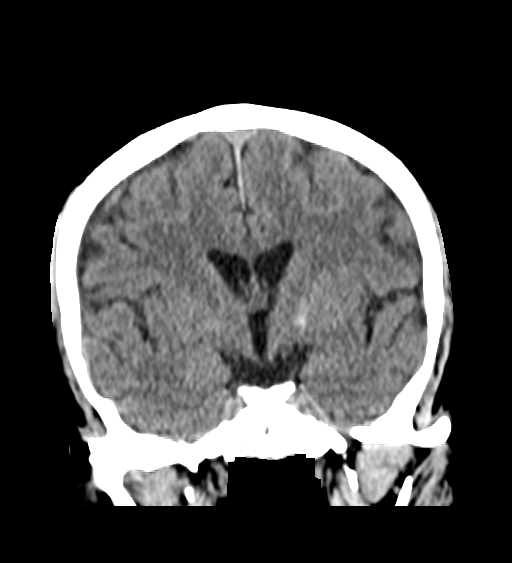
[im 37/67  brain]
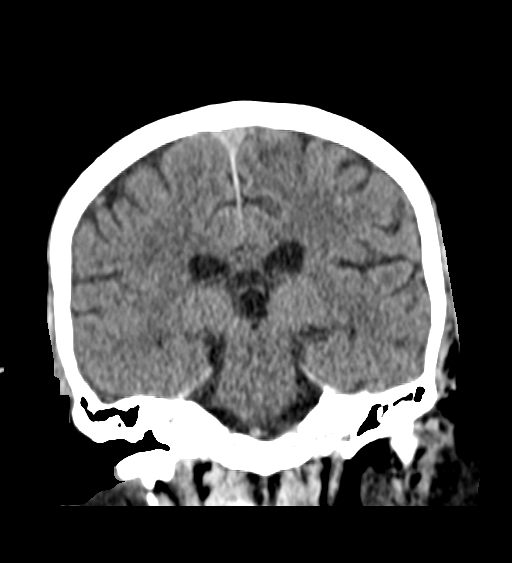

[Series 5: head 3.0 mpr sag · sagittal · 0.54mm/px · 3 of 60 slices shown]
[im 20/60  brain]
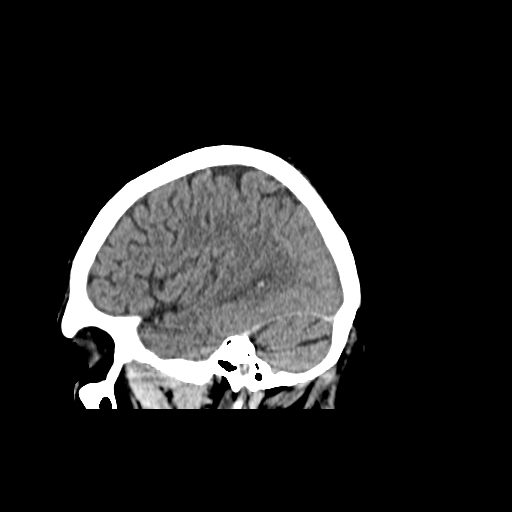
[im 30/60  brain]
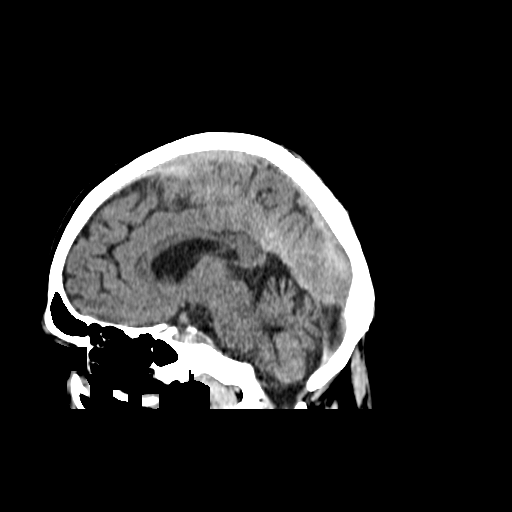
[im 40/60  brain]
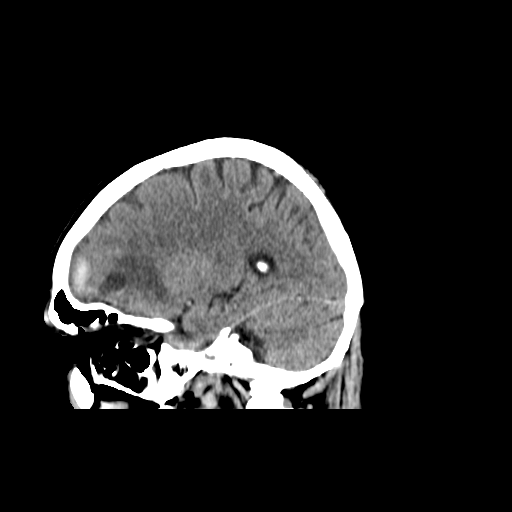

[15 of 47 positions shown; findings below may reference images not displayed]

FINDINGS: Brain: Regressed anterior and inferior left frontal lobe hyperdense
blood products since 01/15/2017. Small volume residual (series 2
images 13 and 16. Anterior inferior left frontal lobe edema and/or
developing encephalomalacia has not progressed. No significant
regional mass effect.

Possible trace parafalcine subdural blood, but regressed and
essentially resolved small right convexity subdural collection since
01/14/2017.

No intracranial mass effect or midline shift. Basilar cisterns
remain patent. No ventriculomegaly or intraventricular hemorrhage.
Dystrophic basal ganglia calcifications again noted. No cortically
based acute infarct identified.

Vascular: Calcified atherosclerosis at the skull base.

Skull: Nondisplaced oblique fracture across the right lateral
calvarium is more apparent than on prior studies (series 3, image
42). The possible nondisplaced left orbital roof fracture is
unchanged. No other No acute osseous abnormality identified.

Sinuses/Orbits: Visualized paranasal sinuses and mastoids are stable
and well pneumatized.

Other: No scalp hematoma identified. Visualized orbit soft tissues
are within normal limits.
IMPRESSION: 1. Regression of the anterior and inferior left frontal lobe
hemorrhage since 01/15/2017 favored to be posttraumatic hemorrhagic
contusion. Residual edema and/or developing encephalomalacia without
mass effect.
2. A nondisplaced oblique fracture across the right lateral
calvarium has become evident; was very subtle on 01/14/2017.
Associated small volume right subdural hematoma has resolved except
for possible small parafalcine component of subdural blood.
3. No intracranial mass effect or new intracranial abnormality.

## 2023-05-01 ENCOUNTER — Inpatient Hospital Stay: Payer: Medicaid Other

## 2023-05-01 ENCOUNTER — Emergency Department: Payer: Medicaid Other

## 2023-05-01 ENCOUNTER — Inpatient Hospital Stay
Admission: AC | Admit: 2023-05-01 | Discharge: 2023-05-16 | DRG: 912 | Disposition: A | Discharge status: Home / Self Care | Payer: Medicaid Other | Attending: Surgical Critical Care | Admitting: Surgical Critical Care

## 2023-05-01 DIAGNOSIS — S37031A Laceration of right kidney, unspecified degree, initial encounter: Secondary | ICD-10-CM | POA: Diagnosis present

## 2023-05-01 DIAGNOSIS — S3282XA Multiple fractures of pelvis without disruption of pelvic ring, initial encounter for closed fracture: Secondary | ICD-10-CM | POA: Diagnosis present

## 2023-05-01 DIAGNOSIS — T07XXXA Unspecified multiple injuries, initial encounter: Secondary | ICD-10-CM | POA: Diagnosis present

## 2023-05-01 DIAGNOSIS — S32591A Other specified fracture of right pubis, initial encounter for closed fracture: Secondary | ICD-10-CM

## 2023-05-01 DIAGNOSIS — S32592A Other specified fracture of left pubis, initial encounter for closed fracture: Secondary | ICD-10-CM

## 2023-05-01 DIAGNOSIS — S43204A Unspecified dislocation of right sternoclavicular joint, initial encounter: Secondary | ICD-10-CM | POA: Diagnosis present

## 2023-05-01 DIAGNOSIS — S225XXA Flail chest, initial encounter for closed fracture: Principal | ICD-10-CM

## 2023-05-01 DIAGNOSIS — S52201A Unspecified fracture of shaft of right ulna, initial encounter for closed fracture: Secondary | ICD-10-CM | POA: Diagnosis present

## 2023-05-01 DIAGNOSIS — S270XXA Traumatic pneumothorax, initial encounter: Secondary | ICD-10-CM

## 2023-05-01 DIAGNOSIS — E871 Hypo-osmolality and hyponatremia: Secondary | ICD-10-CM

## 2023-05-01 DIAGNOSIS — S066XAA Traumatic subarachnoid hemorrhage with loss of consciousness status unknown, initial encounter: Secondary | ICD-10-CM | POA: Diagnosis present

## 2023-05-01 DIAGNOSIS — S35404A Unspecified injury of right renal vein, initial encounter: Secondary | ICD-10-CM | POA: Diagnosis present

## 2023-05-01 DIAGNOSIS — S5291XA Unspecified fracture of right forearm, initial encounter for closed fracture: Secondary | ICD-10-CM

## 2023-05-01 DIAGNOSIS — D62 Acute posthemorrhagic anemia: Secondary | ICD-10-CM | POA: Diagnosis present

## 2023-05-01 DIAGNOSIS — S37061A Major laceration of right kidney, initial encounter: Secondary | ICD-10-CM | POA: Diagnosis present

## 2023-05-01 DIAGNOSIS — Y908 Blood alcohol level of 240 mg/100 ml or more: Secondary | ICD-10-CM | POA: Diagnosis present

## 2023-05-01 DIAGNOSIS — S272XXA Traumatic hemopneumothorax, initial encounter: Secondary | ICD-10-CM | POA: Diagnosis present

## 2023-05-01 DIAGNOSIS — J9601 Acute respiratory failure with hypoxia: Secondary | ICD-10-CM | POA: Diagnosis present

## 2023-05-01 DIAGNOSIS — S271XXA Traumatic hemothorax, initial encounter: Secondary | ICD-10-CM | POA: Diagnosis present

## 2023-05-01 DIAGNOSIS — S52351A Displaced comminuted fracture of shaft of radius, right arm, initial encounter for closed fracture: Secondary | ICD-10-CM | POA: Diagnosis present

## 2023-05-01 DIAGNOSIS — Z23 Encounter for immunization: Secondary | ICD-10-CM

## 2023-05-01 DIAGNOSIS — E669 Obesity, unspecified: Secondary | ICD-10-CM | POA: Diagnosis present

## 2023-05-01 DIAGNOSIS — S52251A Displaced comminuted fracture of shaft of ulna, right arm, initial encounter for closed fracture: Secondary | ICD-10-CM | POA: Diagnosis present

## 2023-05-01 DIAGNOSIS — F10129 Alcohol abuse with intoxication, unspecified: Secondary | ICD-10-CM | POA: Diagnosis present

## 2023-05-01 DIAGNOSIS — S0101XA Laceration without foreign body of scalp, initial encounter: Secondary | ICD-10-CM | POA: Diagnosis present

## 2023-05-01 DIAGNOSIS — Z781 Physical restraint status: Secondary | ICD-10-CM

## 2023-05-01 DIAGNOSIS — T797XXA Traumatic subcutaneous emphysema, initial encounter: Secondary | ICD-10-CM | POA: Diagnosis present

## 2023-05-01 DIAGNOSIS — S42017A Nondisplaced fracture of sternal end of right clavicle, initial encounter for closed fracture: Secondary | ICD-10-CM | POA: Diagnosis present

## 2023-05-01 DIAGNOSIS — S43201A Unspecified subluxation of right sternoclavicular joint, initial encounter: Secondary | ICD-10-CM | POA: Diagnosis present

## 2023-05-01 DIAGNOSIS — J15211 Pneumonia due to Methicillin susceptible Staphylococcus aureus: Secondary | ICD-10-CM | POA: Diagnosis present

## 2023-05-01 DIAGNOSIS — Z6832 Body mass index (BMI) 32.0-32.9, adult: Secondary | ICD-10-CM

## 2023-05-01 DIAGNOSIS — R402341 Coma scale, best motor response, flexion withdrawal, in the field [EMT or ambulance]: Secondary | ICD-10-CM | POA: Diagnosis present

## 2023-05-01 DIAGNOSIS — S066X0A Traumatic subarachnoid hemorrhage without loss of consciousness, initial encounter: Secondary | ICD-10-CM

## 2023-05-01 DIAGNOSIS — R402121 Coma scale, eyes open, to pain, in the field [EMT or ambulance]: Secondary | ICD-10-CM | POA: Diagnosis present

## 2023-05-01 DIAGNOSIS — K7030 Alcoholic cirrhosis of liver without ascites: Secondary | ICD-10-CM | POA: Diagnosis present

## 2023-05-01 DIAGNOSIS — S12110A Anterior displaced Type II dens fracture, initial encounter for closed fracture: Secondary | ICD-10-CM | POA: Diagnosis present

## 2023-05-01 DIAGNOSIS — Z597 Insufficient social insurance and welfare support: Secondary | ICD-10-CM

## 2023-05-01 DIAGNOSIS — R402221 Coma scale, best verbal response, incomprehensible words, in the field [EMT or ambulance]: Secondary | ICD-10-CM | POA: Diagnosis present

## 2023-05-01 DIAGNOSIS — G8911 Acute pain due to trauma: Secondary | ICD-10-CM | POA: Diagnosis present

## 2023-05-01 DIAGNOSIS — D6489 Other specified anemias: Secondary | ICD-10-CM | POA: Diagnosis present

## 2023-05-01 LAB — CBC
Absolute nRBC: 0 10*3/uL (ref <=0.00)
Hematocrit: 43.1 % (ref 37.6–43.7)
Hemoglobin: 14.5 g/dL (ref 12.5–14.8)
MCH: 33.2 pg (ref 25.1–33.5)
MCHC: 33.6 g/dL (ref 31.5–35.8)
MCV: 98.6 fL — ABNORMAL HIGH (ref 78.0–96.0)
MPV: 9.9 fL (ref 8.9–12.5)
Platelet Count: 305 10*3/uL (ref 142–346)
RBC: 4.37 10*6/uL (ref 4.20–5.10)
RDW: 13 % (ref 11–15)
WBC: 13.63 10*3/uL — ABNORMAL HIGH (ref 3.10–9.50)
nRBC %: 0 /100 WBC (ref <=0.0)

## 2023-05-01 LAB — ABO/RH: ABO Rh: O POS

## 2023-05-01 LAB — PT AND APTT
INR: 1.1 (ref 0.9–1.1)
PT: 12.6 s (ref 10.1–12.9)
PTT: 30 s (ref 27–39)

## 2023-05-01 LAB — BASIC METABOLIC PANEL
Anion Gap: 17 — ABNORMAL HIGH (ref 5.0–15.0)
BUN: 8 mg/dL — ABNORMAL LOW (ref 9–21)
CO2: 16 mEq/L — ABNORMAL LOW (ref 17–29)
Calcium: 8.7 mg/dL (ref 7.9–10.2)
Chloride: 105 mEq/L (ref 99–111)
Creatinine: 1 mg/dL (ref 0.5–1.0)
Glucose: 127 mg/dL — ABNORMAL HIGH (ref 70–100)
Potassium: 3.9 mEq/L (ref 3.5–5.3)
Sodium: 138 mEq/L (ref 135–145)

## 2023-05-01 LAB — TYPE AND SCREEN
ABO Rh: O POS
Antibody Screen: NEGATIVE

## 2023-05-01 LAB — LACTIC ACID: Whole Blood Lactic Acid: 4.8 mmol/L (ref 0.2–2.0)

## 2023-05-01 LAB — ETHANOL (ALCOHOL) LEVEL: Alcohol: 360 mg/dL — ABNORMAL HIGH

## 2023-05-01 MED ORDER — SODIUM PHOSPHATES 3 MMOLE/ML IV SOLN (WRAP)
25.0000 mmol | INTRAVENOUS | Status: DC | PRN
Start: 2023-05-01 — End: 2023-05-09

## 2023-05-01 MED ORDER — POTASSIUM CHLORIDE 10 MEQ/100ML IV SOLN (WRAP)
10.0000 meq | INTRAVENOUS | Status: DC | PRN
Start: 2023-05-01 — End: 2023-05-09
  Administered 2023-05-04 (×2): 10 meq via INTRAVENOUS
  Filled 2023-05-01 (×2): qty 100

## 2023-05-01 MED ORDER — CARBOXYMETHYLCELLULOSE SOD PF 0.5 % OP SOLN
1.0000 [drp] | OPHTHALMIC | Status: DC | PRN
Start: 2023-05-01 — End: 2023-05-02

## 2023-05-01 MED ORDER — FENTANYL CITRATE-NACL 1-0.9 MG/100ML-% IV SOLN
0.0000 ug/h | INTRAVENOUS | Status: DC
Start: 2023-05-02 — End: 2023-05-02
  Administered 2023-05-01: 50 ug/h via INTRAVENOUS
  Filled 2023-05-01: qty 100

## 2023-05-01 MED ORDER — LABETALOL HCL 5 MG/ML IV SOLN (WRAP)
10.0000 mg | Freq: Four times a day (QID) | INTRAVENOUS | Status: DC | PRN
Start: 2023-05-01 — End: 2023-05-13
  Administered 2023-05-03 – 2023-05-08 (×3): 10 mg via INTRAVENOUS
  Filled 2023-05-01 (×4): qty 4

## 2023-05-01 MED ORDER — FENTANYL CITRATE (PF) 50 MCG/ML IJ SOLN (WRAP)
INTRAMUSCULAR | Status: AC | PRN
Start: 2023-05-01 — End: 2023-05-01
  Administered 2023-05-01: 100 ug via INTRAVENOUS

## 2023-05-01 MED ORDER — ONDANSETRON HCL 4 MG PO TABS
4.0000 mg | ORAL_TABLET | Freq: Three times a day (TID) | ORAL | Status: DC | PRN
Start: 2023-05-01 — End: 2023-05-16

## 2023-05-01 MED ORDER — LUBRIFRESH P.M. OP OINT
TOPICAL_OINTMENT | OPHTHALMIC | Status: DC | PRN
Start: 2023-05-01 — End: 2023-05-02

## 2023-05-01 MED ORDER — SODIUM CHLORIDE 0.9 % IV BOLUS
1000.0000 mL | Freq: Once | INTRAVENOUS | Status: AC
Start: 2023-05-02 — End: 2023-05-02
  Administered 2023-05-01: 1000 mL via INTRAVENOUS

## 2023-05-01 MED ORDER — SODIUM CHLORIDE 0.9 % IV BOLUS
INTRAVENOUS | Status: AC | PRN
Start: 2023-05-01 — End: 2023-05-01
  Administered 2023-05-01: 1000 mL via INTRAVENOUS

## 2023-05-01 MED ORDER — FENTANYL CITRATE (PF) 50 MCG/ML IJ SOLN (WRAP)
INTRAMUSCULAR | Status: DC
Start: 2023-05-01 — End: 2023-05-01
  Filled 2023-05-01: qty 2

## 2023-05-01 MED ORDER — MIDAZOLAM HCL 1 MG/ML IJ SOLN (WRAP)
INTRAMUSCULAR | Status: AC
Start: 2023-05-01 — End: 2023-05-02
  Filled 2023-05-01: qty 2

## 2023-05-01 MED ORDER — LEVETIRACETAM 500 MG PO TABS
500.0000 mg | ORAL_TABLET | Freq: Two times a day (BID) | ORAL | Status: DC
Start: 2023-05-01 — End: 2023-05-04
  Administered 2023-05-02: 500 mg via ORAL
  Filled 2023-05-01: qty 1

## 2023-05-01 MED ORDER — SODIUM PHOSPHATES 3 MMOLE/ML IV SOLN (WRAP)
15.0000 mmol | INTRAVENOUS | Status: DC | PRN
Start: 2023-05-01 — End: 2023-05-10

## 2023-05-01 MED ORDER — MIDAZOLAM HCL 1 MG/ML IJ SOLN (WRAP)
INTRAMUSCULAR | Status: AC | PRN
Start: 2023-05-01 — End: 2023-05-01
  Administered 2023-05-01: 2 mg via INTRAVENOUS

## 2023-05-01 MED ORDER — FENTANYL CITRATE (PF) 50 MCG/ML IJ SOLN (WRAP)
INTRAMUSCULAR | Status: AC
Start: 2023-05-01 — End: 2023-05-02
  Filled 2023-05-01: qty 2

## 2023-05-01 MED ORDER — POTASSIUM CHLORIDE CRYS ER 20 MEQ PO TBCR
0.0000 meq | EXTENDED_RELEASE_TABLET | ORAL | Status: DC | PRN
Start: 2023-05-01 — End: 2023-05-09

## 2023-05-01 MED ORDER — LEVETIRACETAM IN NACL 500 MG/100ML IV SOLN
500.0000 mg | Freq: Two times a day (BID) | INTRAVENOUS | Status: DC
Start: 2023-05-01 — End: 2023-05-04
  Administered 2023-05-02 – 2023-05-04 (×5): 500 mg via INTRAVENOUS
  Filled 2023-05-01 (×5): qty 100

## 2023-05-01 MED ORDER — IOHEXOL 350 MG/ML IV SOLN
150.0000 mL | Freq: Once | INTRAVENOUS | Status: AC | PRN
Start: 2023-05-01 — End: 2023-05-01
  Administered 2023-05-01: 150 mL via INTRAVENOUS

## 2023-05-01 MED ORDER — TETANUS-DIPHTH-ACELL PERTUSSIS 5-2.5-18.5 LF-MCG/0.5 IM SUSP/SUSY (WR)
0.5000 mL | Freq: Once | INTRAMUSCULAR | Status: AC
Start: 2023-05-01 — End: 2023-05-01

## 2023-05-01 MED ORDER — SUCCINYLCHOLINE CHLORIDE 20 MG/ML IJ SOLN
INTRAMUSCULAR | Status: AC | PRN
Start: 2023-05-01 — End: 2023-05-01
  Administered 2023-05-01: 120 mg via INTRAVENOUS

## 2023-05-01 MED ORDER — ETOMIDATE 2 MG/ML IV SOLN
INTRAVENOUS | Status: AC | PRN
Start: 2023-05-01 — End: 2023-05-01
  Administered 2023-05-01: 20 mg via INTRAVENOUS

## 2023-05-01 MED ORDER — PROPRANOLOL HCL 10 MG PO TABS
20.0000 mg | ORAL_TABLET | Freq: Three times a day (TID) | ORAL | Status: DC
Start: 2023-05-01 — End: 2023-05-02

## 2023-05-01 MED ORDER — SENNOSIDES-DOCUSATE SODIUM 8.6-50 MG PO TABS
1.0000 | ORAL_TABLET | Freq: Two times a day (BID) | ORAL | Status: DC
Start: 2023-05-01 — End: 2023-05-02

## 2023-05-01 MED ORDER — LACTATED RINGERS IV SOLN
INTRAVENOUS | Status: DC
Start: 2023-05-01 — End: 2023-05-02

## 2023-05-01 MED ORDER — MAGNESIUM SULFATE IN D5W 1-5 GM/100ML-% IV SOLN
1.0000 g | INTRAVENOUS | Status: DC | PRN
Start: 2023-05-01 — End: 2023-05-09
  Administered 2023-05-02 – 2023-05-09 (×13): 1 g via INTRAVENOUS
  Filled 2023-05-01 (×11): qty 100

## 2023-05-01 MED ORDER — POTASSIUM CHLORIDE 20 MEQ PO PACK
0.0000 meq | PACK | ORAL | Status: DC | PRN
Start: 2023-05-01 — End: 2023-05-09
  Administered 2023-05-02 – 2023-05-07 (×4): 40 meq via ORAL
  Administered 2023-05-08: 20 meq via ORAL
  Filled 2023-05-01 (×3): qty 2
  Filled 2023-05-01: qty 1
  Filled 2023-05-01: qty 2

## 2023-05-01 MED ORDER — SODIUM PHOSPHATES 3 MMOLE/ML IV SOLN (WRAP)
35.0000 mmol | INTRAVENOUS | Status: DC | PRN
Start: 2023-05-01 — End: 2023-05-09

## 2023-05-01 MED ORDER — TETANUS-DIPHTH-ACELL PERTUSSIS 5-2.5-18.5 LF-MCG/0.5 IM SUSP/SUSY (WR)
INTRAMUSCULAR | Status: AC
Start: 2023-05-01 — End: 2023-05-01
  Administered 2023-05-01: 0.5 mL via INTRAMUSCULAR
  Filled 2023-05-01: qty 0.5

## 2023-05-01 MED ORDER — SUCCINYLCHOLINE CHLORIDE 20 MG/ML IJ SOLN
INTRAMUSCULAR | Status: AC
Start: 2023-05-01 — End: 2023-05-02
  Filled 2023-05-01: qty 10

## 2023-05-01 MED ORDER — ETOMIDATE 2 MG/ML IV SOLN
INTRAVENOUS | Status: AC
Start: 2023-05-01 — End: 2023-05-02
  Filled 2023-05-01: qty 20

## 2023-05-01 MED ORDER — ONDANSETRON HCL 4 MG/2ML IJ SOLN
4.0000 mg | Freq: Three times a day (TID) | INTRAMUSCULAR | Status: DC | PRN
Start: 2023-05-01 — End: 2023-05-16
  Administered 2023-05-08: 4 mg via INTRAVENOUS
  Filled 2023-05-01: qty 2

## 2023-05-01 NOTE — ED Notes (Signed)
MD Bowyer declined upgrade at this time.

## 2023-05-01 NOTE — ED Notes (Signed)
Log roll c spine maintained. Long board removed.
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
## 2023-05-01 NOTE — ED Notes (Signed)
Fluids placed in pressure bag

## 2023-05-01 NOTE — ED Notes (Signed)
ID band 839 B. GaRNET L wrist

## 2023-05-01 NOTE — ED Notes (Signed)
This RN transported patient to CT, noted to have blood pressure 60/40 which then improved to 70's/40's with repeated measurements. Patient tachycardic at 115. Resident at bedside given verbal for 1 unit whole blood to  be transfused. Whole blood given in the left [facility] and verified with Toma Copier, RN while on CT scanner.

## 2023-05-01 NOTE — Progress Notes (Shared)
Posterior scalp laceration

## 2023-05-02 ENCOUNTER — Inpatient Hospital Stay: Payer: Medicaid Other

## 2023-05-02 DIAGNOSIS — S3282XA Multiple fractures of pelvis without disruption of pelvic ring, initial encounter for closed fracture: Secondary | ICD-10-CM | POA: Diagnosis present

## 2023-05-02 DIAGNOSIS — S35404A Unspecified injury of right renal vein, initial encounter: Secondary | ICD-10-CM | POA: Diagnosis present

## 2023-05-02 DIAGNOSIS — S270XXA Traumatic pneumothorax, initial encounter: Secondary | ICD-10-CM | POA: Diagnosis present

## 2023-05-02 DIAGNOSIS — S32592A Other specified fracture of left pubis, initial encounter for closed fracture: Secondary | ICD-10-CM

## 2023-05-02 DIAGNOSIS — S066XAA Traumatic subarachnoid hemorrhage with loss of consciousness status unknown, initial encounter: Secondary | ICD-10-CM | POA: Diagnosis present

## 2023-05-02 DIAGNOSIS — S32591A Other specified fracture of right pubis, initial encounter for closed fracture: Secondary | ICD-10-CM

## 2023-05-02 DIAGNOSIS — S5291XA Unspecified fracture of right forearm, initial encounter for closed fracture: Secondary | ICD-10-CM | POA: Diagnosis present

## 2023-05-02 DIAGNOSIS — S225XXA Flail chest, initial encounter for closed fracture: Secondary | ICD-10-CM | POA: Diagnosis present

## 2023-05-02 DIAGNOSIS — S37031A Laceration of right kidney, unspecified degree, initial encounter: Secondary | ICD-10-CM | POA: Diagnosis present

## 2023-05-02 DIAGNOSIS — S43204A Unspecified dislocation of right sternoclavicular joint, initial encounter: Secondary | ICD-10-CM | POA: Diagnosis present

## 2023-05-02 DIAGNOSIS — S52201A Unspecified fracture of shaft of right ulna, initial encounter for closed fracture: Secondary | ICD-10-CM | POA: Insufficient documentation

## 2023-05-02 DIAGNOSIS — T07XXXA Unspecified multiple injuries, initial encounter: Secondary | ICD-10-CM

## 2023-05-02 LAB — HEPATIC FUNCTION PANEL (LFT)
ALT: 115 U/L — ABNORMAL HIGH (ref 0–55)
ALT: 125 U/L — ABNORMAL HIGH (ref 0–55)
AST (SGOT): 285 U/L — ABNORMAL HIGH (ref 5–41)
AST (SGOT): 283 U/L — ABNORMAL HIGH (ref 5–41)
Albumin/Globulin Ratio: 0.9 (ref 0.9–2.2)
Albumin/Globulin Ratio: 0.9 (ref 0.9–2.2)
Albumin: 2.5 g/dL — ABNORMAL LOW (ref 3.5–5.0)
Albumin: 2.8 g/dL — ABNORMAL LOW (ref 3.5–5.0)
Alkaline Phosphatase: 57 U/L (ref 37–117)
Alkaline Phosphatase: 61 U/L (ref 37–117)
Bilirubin Direct: 0.6 mg/dL — ABNORMAL HIGH (ref 0.0–0.5)
Bilirubin Direct: 0.7 mg/dL — ABNORMAL HIGH (ref 0.0–0.5)
Bilirubin Indirect: 0.7 mg/dL (ref 0.2–1.0)
Bilirubin Indirect: 1.1 mg/dL — ABNORMAL HIGH (ref 0.2–1.0)
Bilirubin, Total: 1.3 mg/dL — ABNORMAL HIGH (ref 0.2–1.2)
Bilirubin, Total: 1.8 mg/dL — ABNORMAL HIGH (ref 0.2–1.2)
Globulin: 2.8 g/dL (ref 2.0–3.6)
Globulin: 3.2 g/dL (ref 2.0–3.6)
Protein, Total: 5.3 g/dL — ABNORMAL LOW (ref 6.0–8.3)
Protein, Total: 6 g/dL (ref 6.0–8.3)

## 2023-05-02 LAB — CBC
Absolute nRBC: 0 10*3/uL (ref <=0.00)
Absolute nRBC: 0 10*3/uL (ref <=0.00)
Absolute nRBC: 0 10*3/uL (ref <=0.00)
Absolute nRBC: 0 10*3/uL (ref <=0.00)
Absolute nRBC: 0 10*3/uL (ref <=0.00)
Hematocrit: 34.6 % — ABNORMAL LOW (ref 37.6–43.7)
Hematocrit: 35.5 % — ABNORMAL LOW (ref 37.6–43.7)
Hematocrit: 35.8 % — ABNORMAL LOW (ref 37.6–49.6)
Hematocrit: 36.5 % — ABNORMAL LOW (ref 37.6–49.6)
Hematocrit: 33.2 % — ABNORMAL LOW (ref 37.6–49.6)
Hemoglobin: 11.6 g/dL — ABNORMAL LOW (ref 12.5–14.8)
Hemoglobin: 11.6 g/dL — ABNORMAL LOW (ref 12.5–14.8)
Hemoglobin: 12.3 g/dL — ABNORMAL LOW (ref 12.5–17.1)
Hemoglobin: 12.3 g/dL — ABNORMAL LOW (ref 12.5–17.1)
Hemoglobin: 11.2 g/dL — ABNORMAL LOW (ref 12.5–17.1)
MCH: 32 pg (ref 25.1–33.5)
MCH: 31.8 pg (ref 25.1–33.5)
MCH: 32.1 pg (ref 25.1–33.5)
MCH: 31.7 pg (ref 25.1–33.5)
MCH: 31.5 pg (ref 25.1–33.5)
MCHC: 33.5 g/dL (ref 31.5–35.8)
MCHC: 32.7 g/dL (ref 31.5–35.8)
MCHC: 34.4 g/dL (ref 31.5–35.8)
MCHC: 33.7 g/dL (ref 31.5–35.8)
MCHC: 33.7 g/dL (ref 31.5–35.8)
MCV: 95.6 fL (ref 78.0–96.0)
MCV: 97.3 fL — ABNORMAL HIGH (ref 78.0–96.0)
MCV: 93.5 fL (ref 78.0–96.0)
MCV: 94.1 fL (ref 78.0–96.0)
MCV: 93.5 fL (ref 78.0–96.0)
MPV: 10.4 fL (ref 8.9–12.5)
MPV: 10.4 fL (ref 8.9–12.5)
MPV: 10.3 fL (ref 8.9–12.5)
MPV: 10.3 fL (ref 8.9–12.5)
MPV: 10.2 fL (ref 8.9–12.5)
Platelet Count: 211 10*3/uL (ref 142–346)
Platelet Count: 204 10*3/uL (ref 142–346)
Platelet Count: 190 10*3/uL (ref 142–346)
Platelet Count: 171 10*3/uL (ref 142–346)
Platelet Count: 146 10*3/uL (ref 142–346)
RBC: 3.62 10*6/uL — ABNORMAL LOW (ref 4.20–5.10)
RBC: 3.65 10*6/uL — ABNORMAL LOW (ref 4.20–5.10)
RBC: 3.83 10*6/uL — ABNORMAL LOW (ref 4.20–5.90)
RBC: 3.88 10*6/uL — ABNORMAL LOW (ref 4.20–5.90)
RBC: 3.55 10*6/uL — ABNORMAL LOW (ref 4.20–5.90)
RDW: 16 % — ABNORMAL HIGH (ref 11–15)
RDW: 16 % — ABNORMAL HIGH (ref 11–15)
RDW: 16 % — ABNORMAL HIGH (ref 11–15)
RDW: 16 % — ABNORMAL HIGH (ref 11–15)
RDW: 16 % — ABNORMAL HIGH (ref 11–15)
WBC: 15 10*3/uL — ABNORMAL HIGH (ref 3.10–9.50)
WBC: 13.99 10*3/uL — ABNORMAL HIGH (ref 3.10–9.50)
WBC: 12.16 10*3/uL — ABNORMAL HIGH (ref 3.10–9.50)
WBC: 13.84 10*3/uL — ABNORMAL HIGH (ref 3.10–9.50)
WBC: 12.64 10*3/uL — ABNORMAL HIGH (ref 3.10–9.50)
nRBC %: 0 /100 WBC (ref <=0.0)
nRBC %: 0 /100 WBC (ref <=0.0)
nRBC %: 0 /100 WBC (ref <=0.0)
nRBC %: 0 /100 WBC (ref <=0.0)
nRBC %: 0 /100 WBC (ref <=0.0)

## 2023-05-02 LAB — URINE DRUGS OF ABUSE SCREEN
Urine Amphetamine Screen: NEGATIVE
Urine Barbituate Screen: NEGATIVE
Urine Benzodiazepine Screen: POSITIVE — AB
Urine Cannabinoid Screen: NEGATIVE
Urine Cocaine Screen: NEGATIVE
Urine Fentanyl Screen: POSITIVE — AB
Urine Opiate Screen: NEGATIVE
Urine PCP Screen: NEGATIVE

## 2023-05-02 LAB — FOLATE
Folate: 7.9 ng/mL
Hemolysis Index: 65 Index

## 2023-05-02 LAB — LAB USE ONLY - URINALYSIS WITH REFLEX TO MICROSCOPIC EXAM IF INDICATED
Urine Bilirubin: NEGATIVE
Urine Glucose: NEGATIVE
Urine Ketones: NEGATIVE mg/dL
Urine Leukocyte Esterase: NEGATIVE
Urine Nitrite: NEGATIVE
Urine Specific Gravity: 1.045 — ABNORMAL HIGH (ref 1.001–1.035)
Urine Urobilinogen: NORMAL mg/dL (ref 0.2–2.0)
Urine pH: 6 (ref 5.0–8.0)

## 2023-05-02 LAB — BASIC METABOLIC PANEL
Anion Gap: 14 (ref 5.0–15.0)
Anion Gap: 12 (ref 5.0–15.0)
BUN: 9 mg/dL (ref 9–21)
BUN: 9 mg/dL (ref 9–28)
CO2: 13 mEq/L — ABNORMAL LOW (ref 17–29)
CO2: 14 mEq/L — ABNORMAL LOW (ref 17–29)
Calcium: 7 mg/dL — ABNORMAL LOW (ref 7.9–10.2)
Calcium: 6.8 mg/dL — ABNORMAL LOW (ref 8.5–10.5)
Chloride: 111 mEq/L (ref 99–111)
Chloride: 113 mEq/L — ABNORMAL HIGH (ref 99–111)
Creatinine: 0.9 mg/dL (ref 0.5–1.0)
Creatinine: 0.9 mg/dL (ref 0.5–1.5)
GFR: >60 mL/min/{1.73_m2} (ref >=60.0)
Glucose: 101 mg/dL — ABNORMAL HIGH (ref 70–100)
Glucose: 103 mg/dL — ABNORMAL HIGH (ref 70–100)
Potassium: 3.9 mEq/L (ref 3.5–5.3)
Potassium: 3.6 mEq/L (ref 3.5–5.3)
Sodium: 138 mEq/L (ref 135–145)
Sodium: 139 mEq/L (ref 135–145)

## 2023-05-02 LAB — LACTIC ACID
Whole Blood Lactic Acid: 14.6 mmol/L (ref 0.2–2.0)
Whole Blood Lactic Acid: 5.2 mmol/L (ref 0.2–2.0)
Whole Blood Lactic Acid: 2.4 mmol/L — ABNORMAL HIGH (ref 0.2–2.0)

## 2023-05-02 LAB — PHOSPHORUS: Phosphorus: 3.7 mg/dL (ref 2.3–4.7)

## 2023-05-02 LAB — CREATINE KINASE (CK)
Creatine Kinase (CK): 6165 U/L — ABNORMAL HIGH (ref 47–294)
Creatine Kinase (CK): 6209 U/L — ABNORMAL HIGH (ref 47–294)
Creatine Kinase (CK): 5463 U/L — ABNORMAL HIGH (ref 47–294)

## 2023-05-02 LAB — MAGNESIUM: Magnesium: 1.4 mg/dL — ABNORMAL LOW (ref 1.6–2.6)

## 2023-05-02 LAB — SERUM HCG, QUALITATIVE: hCG Qualitative: NEGATIVE

## 2023-05-02 LAB — HIGH SENSITIVITY TROPONIN-I WITH DELTA
hs Troponin-I Delta: 8
hs Troponin: 26 ng/L (ref <=35.0)

## 2023-05-02 LAB — HIGH SENSITIVITY TROPONIN-I: hs Troponin: 18 ng/L — ABNORMAL HIGH (ref <=14.0)

## 2023-05-02 LAB — VITAMIN B12: Vitamin B-12: 231 pg/mL (ref 211–911)

## 2023-05-02 MED ORDER — SODIUM CHLORIDE 0.9 % IV BOLUS
1000.0000 mL | Freq: Once | INTRAVENOUS | Status: AC
Start: 2023-05-02 — End: 2023-05-02
  Administered 2023-05-02: 1000 mL via INTRAVENOUS

## 2023-05-02 MED ORDER — METHOCARBAMOL 1000 MG/10ML IJ SOLN
1000.0000 mg | Freq: Three times a day (TID) | INTRAVENOUS | Status: DC
Start: 2023-05-02 — End: 2023-05-02
  Administered 2023-05-02: 1000 mg via INTRAVENOUS
  Filled 2023-05-02 (×3): qty 10

## 2023-05-02 MED ORDER — CARBOXYMETHYLCELLULOSE SOD PF 0.5 % OP SOLN
1.0000 [drp] | OPHTHALMIC | Status: DC | PRN
Start: 2023-05-02 — End: 2023-05-09

## 2023-05-02 MED ORDER — ACETAMINOPHEN 10 MG/ML IV SOLN
1000.0000 mg | Freq: Three times a day (TID) | INTRAVENOUS | Status: DC
Start: 2023-05-02 — End: 2023-05-02

## 2023-05-02 MED ORDER — SODIUM CHLORIDE 0.9 % IV SOLN
INTRAVENOUS | Status: DC
Start: 2023-05-02 — End: 2023-05-03

## 2023-05-02 MED ORDER — DEXMEDETOMIDINE HCL IN NACL 400 MCG/100ML IV SOLN
0.0000 ug/kg/h | INTRAVENOUS | Status: DC
Start: 2023-05-02 — End: 2023-05-09
  Administered 2023-05-02: 0.2 ug/kg/h via INTRAVENOUS
  Administered 2023-05-03: 0.3 ug/kg/h via INTRAVENOUS
  Administered 2023-05-03 – 2023-05-04 (×3): 0.4 ug/kg/h via INTRAVENOUS
  Administered 2023-05-04: 0.6 ug/kg/h via INTRAVENOUS
  Administered 2023-05-05: 0.4 ug/kg/h via INTRAVENOUS
  Administered 2023-05-05 – 2023-05-06 (×2): 0.6 ug/kg/h via INTRAVENOUS
  Administered 2023-05-06 – 2023-05-07 (×2): 0.4 ug/kg/h via INTRAVENOUS
  Administered 2023-05-07 – 2023-05-08 (×3): 0.5 ug/kg/h via INTRAVENOUS
  Filled 2023-05-02 (×14): qty 100

## 2023-05-02 MED ORDER — ACETAMINOPHEN 10 MG/ML IV SOLN
1000.0000 mg | Freq: Four times a day (QID) | INTRAVENOUS | Status: DC
Start: 2023-05-02 — End: 2023-05-02
  Administered 2023-05-02: 1000 mg via INTRAVENOUS
  Filled 2023-05-02 (×2): qty 100

## 2023-05-02 MED ORDER — HYDROMORPHONE HCL 1 MG/ML IJ SOLN
0.4000 mg | INTRAMUSCULAR | Status: DC | PRN
Start: 2023-05-02 — End: 2023-05-02

## 2023-05-02 MED ORDER — METHOCARBAMOL 500 MG PO TABS
1000.0000 mg | ORAL_TABLET | Freq: Four times a day (QID) | ORAL | Status: DC
Start: 2023-05-02 — End: 2023-05-03
  Administered 2023-05-02 – 2023-05-03 (×5): 1000 mg via ORAL
  Filled 2023-05-02 (×5): qty 2

## 2023-05-02 MED ORDER — NOREPINEPHRINE-DEXTROSE 8-5 MG/250ML-% IV SOLN (IHS)
0.0000 ug/min | INTRAVENOUS | Status: DC
Start: 2023-05-02 — End: 2023-05-03
  Filled 2023-05-02: qty 250

## 2023-05-02 MED ORDER — SENNOSIDES-DOCUSATE SODIUM 8.6-50 MG PO TABS
2.0000 | ORAL_TABLET | Freq: Two times a day (BID) | ORAL | Status: DC
Start: 2023-05-02 — End: 2023-05-16
  Administered 2023-05-02 – 2023-05-16 (×24): 2 via ORAL
  Filled 2023-05-02 (×23): qty 2

## 2023-05-02 MED ORDER — FENTANYL CITRATE (PF) 50 MCG/ML IJ SOLN (WRAP)
50.0000 ug | INTRAMUSCULAR | Status: DC | PRN
Start: 2023-05-02 — End: 2023-05-03

## 2023-05-02 MED ORDER — THIAMINE (VITAMIN B1) 100 MG PO TABS (WRAP)
200.0000 mg | ORAL_TABLET | Freq: Every day | ORAL | Status: DC
Start: 2023-05-08 — End: 2023-05-02

## 2023-05-02 MED ORDER — PHENOBARBITAL SODIUM 130 MG/ML IJ SOLN
32.5000 mg | Freq: Three times a day (TID) | INTRAMUSCULAR | Status: DC
Start: 2023-05-04 — End: 2023-05-02

## 2023-05-02 MED ORDER — GABAPENTIN 100 MG PO CAPS
200.0000 mg | ORAL_CAPSULE | Freq: Three times a day (TID) | ORAL | Status: DC
Start: 2023-05-02 — End: 2023-05-07
  Administered 2023-05-02 – 2023-05-07 (×14): 200 mg via ORAL
  Filled 2023-05-02 (×3): qty 2
  Filled 2023-05-02: qty 1
  Filled 2023-05-02 (×5): qty 2
  Filled 2023-05-02: qty 1
  Filled 2023-05-02 (×2): qty 2
  Filled 2023-05-02 (×2): qty 1
  Filled 2023-05-02 (×2): qty 2

## 2023-05-02 MED ORDER — SODIUM CHLORIDE 0.9 % IV SOLN
INTRAVENOUS | Status: DC | PRN
Start: 2023-05-02 — End: 2023-05-03

## 2023-05-02 MED ORDER — PHENOBARBITAL SODIUM 130 MG/ML IJ SOLN
65.0000 mg | Freq: Three times a day (TID) | INTRAMUSCULAR | Status: DC
Start: 2023-05-02 — End: 2023-05-02
  Administered 2023-05-02 (×2): 65 mg via INTRAVENOUS
  Filled 2023-05-02 (×2): qty 1

## 2023-05-02 MED ORDER — FENTANYL BOLUS FROM BAG
50.0000 ug | INTRAVENOUS | Status: DC | PRN
Start: 2023-05-02 — End: 2023-05-03
  Administered 2023-05-01 – 2023-05-02 (×11): 50 ug via INTRAVENOUS

## 2023-05-02 MED ORDER — ACETAMINOPHEN 325 MG PO TABS
650.0000 mg | ORAL_TABLET | Freq: Four times a day (QID) | ORAL | Status: DC
Start: 2023-05-02 — End: 2023-05-03
  Administered 2023-05-02 – 2023-05-03 (×5): 650 mg via ORAL
  Filled 2023-05-02 (×5): qty 2

## 2023-05-02 MED ORDER — LUBRIFRESH P.M. OP OINT
TOPICAL_OINTMENT | OPHTHALMIC | Status: DC | PRN
Start: 2023-05-02 — End: 2023-05-09

## 2023-05-02 MED ORDER — FOLIC ACID 1 MG PO TABS
1.0000 mg | ORAL_TABLET | Freq: Every day | ORAL | Status: DC
Start: 2023-05-02 — End: 2023-05-02
  Administered 2023-05-02: 1 mg via ORAL
  Filled 2023-05-02: qty 1

## 2023-05-02 MED ORDER — THIAMINE HCL 100 MG/ML IJ SOLN
500.0000 mg | Freq: Three times a day (TID) | INTRAVENOUS | Status: DC
Start: 2023-05-02 — End: 2023-05-02
  Administered 2023-05-02: 500 mg via INTRAVENOUS
  Filled 2023-05-02 (×3): qty 5

## 2023-05-02 MED ORDER — GABAPENTIN 100 MG PO CAPS
200.0000 mg | ORAL_CAPSULE | Freq: Three times a day (TID) | ORAL | Status: DC
Start: 2023-05-02 — End: 2023-05-02

## 2023-05-02 MED ORDER — FENTANYL CITRATE-NACL 1-0.9 MG/100ML-% IV SOLN
0.0000 ug/h | INTRAVENOUS | Status: DC
Start: 2023-05-02 — End: 2023-05-03
  Administered 2023-05-02: 75 ug/h via INTRAVENOUS
  Administered 2023-05-02: 50 ug/h via INTRAVENOUS
  Administered 2023-05-02 – 2023-05-03 (×2): 100 ug/h via INTRAVENOUS
  Filled 2023-05-02 (×3): qty 100

## 2023-05-02 MED ORDER — THIAMINE HCL 100 MG/ML IJ SOLN
250.0000 mg | INTRAVENOUS | Status: DC
Start: 2023-05-05 — End: 2023-05-02

## 2023-05-02 MED ORDER — TAB-A-VITE/BETA CAROTENE PO TABS
1.0000 | ORAL_TABLET | Freq: Every day | ORAL | Status: DC
Start: 2023-05-02 — End: 2023-05-02
  Administered 2023-05-02: 1 via ORAL
  Filled 2023-05-02: qty 1

## 2023-05-02 NOTE — Discharge Instr - AVS First Page (Addendum)
TRAUMA ACUTE CARE SURGERY DISCHARGE INSTRUCTIONS    Your primary management team during your stay was the Trauma Acute Care Surgery [facility]) team.  Our office/mailing address is:      Trauma Acute Care Services  [address]  [place], [place] [address]  Phone: [phone]   Fax: [phone]      Follow Up Information:  Not all patients need to follow up with our trauma surgeons.  If you are recommended to do so, please call [phone] within 48 hours of your discharge from the hospital, acute rehab facility, or skilled nursing facility.  Our trauma clinic is held on Tuesdays.    Do you need to make an appointment to be seen in the trauma clinic?:  Yes. It is recommended you follow up outpatient with Trauma Clinic regarding your rib fractures and pneumothorax. Unfortunately, if you have no medical insurance, you would have a $175 copay.  Please attend your trauma clinic follow up appointment scheduled for Tuesday May 30, 2023 @ 2:30 PM (Arrive by 2:15 PM) with a repeat chest xray completed PRIOR    IMAGING:  You will need a repeat x-ray, early the morning of your trauma clinic appointment.  The order has been placed within [facility] computer system.  Please call [facility] to schedule your imaging [phone].       Diet:  Recommendations:  Solids:  Soft and Bite Sized (IDDSI SB6)  Liquids: Thin Liquids (IDDSI TN0) via, any modality  Meds: PO, whole, with liquid      Precautions:   Precautions/Compensations: alert and awake, upright positioning, external pacing  Supervision: 1:1 supervision    Activity: Weightbearing as tolerated; Activity as tolerated; avoid heavy lifting or strenuous activities; walking encouraged; being out of the bed/sitting upright during the daytime is encouraged  - Walk frequently  - Continue to use incentive spirometer 10 times per hour while wake, your goal is  - It is important to cough and deep breathe, this helps prevent pneumonia which is the  greatest risk factor with broken ribs.   - Return to the ER with increasing shortness of breath, fever or worsening chest pain    Activity do's and don'ts  Recovery generally takes 3 to 12 weeks. Unless your  doctor tells you otherwise, during this time follow the  general guidelines below.    DON'T do the following activities:   Don't lift more than 10 pounds. In other words, don't  lift anything heavier than a gallon of milk.   Don't push or pull anything heavy. For example, don't  vacuum, mow, or shovel.   Don't do activities that could cause injury, such as contact  sports or high-impact exercise. For example, avoid:       - Football, basketball, or wrestling       - Sports that require a helmet       - Hiking, biking, or running       - Horseback riding or ATV riding       - Aerobics, crunches, or sit-ups    DO the following activities:   Walk, do low-impact exercise, and resume normal daily  Activities    Warning signs -- when to get help  If you have any of the symptoms below -- especially if  they are getting worse -- call the Trauma Service or go  to the nearest emergency department:   Lightheadedness or dizziness   Increased pain in your abdomen  Swelling in your abdomen, or feeling overly full   Fever of 102F or higher   Nausea or vomiting that doesn't get better   Trouble with emptying your bowels, or constipation  that doesn't get better    Wound Care: Soap and water to abrasions; you may shower    Additional special instructions upon discharge: Avoid NSAIDs such as Ibuprofen, Advil, Motrin, Naproxen, Aleve, and Aspirin until you follow up with trauma clinic due to your right renal injury. OTC Tylenol is okay.     - During your hospitalization, your blood sodium level was decreased. It is recommended you follow up outpatient with a PCP within 1 week of discharge for a post-hospitalization check. Continue taking your salt tablets medication at home and limiting your water intake to less than 1 Liter  per day. Beverages such as Gatorade, Powerade, Juice encouraged at this time.    - You have been referred to [facility] for a post-hospitalization appointment with a primary care provider. This clinic specializes in individuals who are uninsured and do not have a primary care provider:  Please call to schedule your appointment within 1 week of discharge.      Wounds/Surgical Incisions:  You will be given specific instructions on how to care for your wound.  In general, dressings may be removed 48 hours after your operation or discharge and you may shower.  Do not soak your incisions.  If the wound should become red, warm or starts draining fluid, or you if you begin to have a fever or increased pain at the site, please contact our office.      Forms requiring a provider's signature:  If you have insurance forms, Family and Medical Leave (FMLA) forms, or worker's compensation forms that need to be filled out by your provider, please mail or fax the forms to our office at the mailing address listed above.    Pain Medication:  DO NOT drive or operate machinery while taking pain medication  Avoid drinking alcohol while taking pain medicine    The trauma service can manage your general postoperative or post-trauma pain. If your pain is from injuries or an operation that was handled or performed by another surgery team or consultant, such as Orthopedics, Neurosurgery, or Spine surgery, we kindly request that you call that surgeon's office to address your pain needs. If you have follow-up with the trauma service, please call the trauma office.  Please plan accordingly as refills can take up to 48-72 hours.    It is important to take your medications on time and never take more than the prescribed amount  Take with food to avoid an upset stomach  Medications take time to work, up to 20-30 minutes to take effect    If the pain lessens, try taking your pain medication less often  Eliminate  a dose of pain medication at a time of day when you experience less pain    Initiate use of Tylenol as your pain decreases, until you no longer need to take any medication for pain     Constipation is a common side effect of pain medications  You should be taking a stool softener (ex. Colace) as long as you are on any narcotic pain medication.    Eat lots of fruits and vegetables and keep hydrated   Over the counter laxatives and enemas are available at your local pharmacy       For questions regarding these instructions, please contact  the [facility] Nurse Navigator, Jadene Pierini, at [phone] or [email or web address], Monday-Friday 8am-4pm                     [facility] - Neurosurgery  [address]  [place], [place] [address]  Phone: [phone] and Fax: [phone]    NEUROSURGERY DISCHARGE INSTRUCTIONS AFTER NON-OPERATIVE SPINAL INJURY:    Neurosurgeon: Dr. Sherril Croon    -You will need a follow-up in 6 weeks with Malachi Carl NP.       IMAGING:  -You will need a repeat xray of the spine, to be completed 1-5 days prior to your appointment with the neurosurgeon.  -Please Call [facility] [phone] or [facility] to schedule your imaging [phone].     Instructions for Back/Neck fracture without surgery    EXPECTATIONS:  Call us if the symptoms suddenly worsen.     MEDICATIONS:  Avoid taking nicotine, steroids, NSAIDs / Aleve / Ibuprofen / Toradol /celebrex in the first 12 weeks after your spine fracture.  Take Tylenol for pain.    SMOKING:  Smoking delays the healing process; we request that you avoid smoking if possible    ACTIVITY:  No bending or lifting anything greater than about 5 lbs. It is very important to get up and moving at home, but it is equally important to not overdo activities. Plan to have some assistance at home for the first few days. You will be able to walk, take stairs (carefully and slowly), use the bathroom, and get up from  sitting to standing using the arms of the chair as support. Slowly increase your activity back to baseline. For example, increase from a short, less than 5 minute walk the first few days, to around the block and up to 15-20 minutes after a week. Do not bend, twist or lean to pick things up or change positions. Remember good back mechanics and use your legs to sit down and stand up.  Less movement of the fractured bone allows for  greater chance of healing.  Activities that keep spine in good alignment and minimize movement of the fractured bone is the recommendation at this time.    PHYSICAL THERAPY:  We will discuss PT at your first follow up visit. You will not begin therapy until after your first follow up appointment.    SEXUAL ACTIVITY:  Sexual activity is not advised until you have followed up with [place] in the clinic.     DRIVING:  If you had a fracture in your back, you may resume driving after 1 week. For neck fracture you need to wait until you have followed up with the clinic and are out of the cervical collar.     WORK:  Do not return to work until you have been advised to do so by staff. Generally we recommend taking 4 weeks off of work for recovery, but each individual situation will be reviewed independently.    HOUSEWORK:  Avoid vacuuming or laundry until you have followed up in the clinic. The bending and lifting motion can place pressure on your back and neck    PROBLEMS or CONCERNS:  Please feel free to call the office at any time for problems, concerns, or questions you may have. We are always available to help you or answer a question. In an emergency, please call 911 or go to the nearest Emergency Department.    Our office is open Monday-Friday 0800-4:30 pm. If you  contact the office after 4:30 pm you will be directed to the after-hours service, which will be an inpatient neurosurgery resident physician or physician assistant.             [facility]  [address]  [address]  [place], [place] [address]  Phone: [phone]  Fax [phone]    DISCHARGE INSTRUCTIONS:  Date of Admission: 05/01/2023   Admission Diagnosis: Critical polytrauma [T07.XXXA]   Date of Most Recent Surgery: 05/10/2023 Procedure(s) (LRB):  OPEN TREATMENT, INTERNAL FIXATION, RADIAL AND ULNAR SHAFT FRACTURES (Right)    Please contact the office as soon as possible to schedule a follow-up appointment with Emiliano Dyer, MD. You will need to be seen approximately 2 weeks after your discharge from the hospital.   - Please plan to arrive at least 15 minutes prior to your appointment time to allow yourself time for parking and check-in.   - If needed, we will obtain x-rays at your first orthopaedic follow-up appointment. X-rays do not need to be done prior to your appointment unless you have certain types of insurance or have been instructed to do so (if you have multiple injuries). If you have questions, please contact our office for guidance.  - If you underwent surgery, your incision will be examined at your first follow-up appointment and typically, staples/sutures will be removed.  - Please check with your insurance to see if a referral is needed prior to your first visit to avoid any delays.  - Your insurance is noted as: Payor: / . Please contact our office if this needs to be changed.    Activity / Weight Bearing Status:  Weightbearing as tolerated on the Right Upper Extremity      Wound Care:  - If you notice increased drainage, incision redness, or foul smell from the incision/wound and/or develop a fever, please inform the office immediately and/or proceed directly to the Emergency Department for prompt care.  - Soft Dressings (no splint or cast): You may shower 3 days after surgery (no baths or submerging incision) unless otherwise instructed. Do not scrub incision, and pat the incision dry with a clean towel.  You may change dressings as needed with clean gauze and tape or ACE bandages, unless given  specific instructions.    Medications:  -In order to minimize narcotic use and establish better pain control, we employ a multimodal pain approach. Dosing of medications will vary from patient to patient based on your specific needs, allergies and past medical history, so please refer to your discharge medication list.   - Pain management protocol utilizes several medications all listed below:    - Tylenol: Tylenol (acetaminophen) can be obtained over-the-counter. It is important limit dosage to no more than 3000 mg in a 24 hr period. Patients with liver problems are advised to refrain from taking until discussed with primary care provider. Usual dosing for adults is 1000mg  every 8 hrs for the first 2-3 weeks, then as needed.  For children, we recommend weight based dosing as directed on the medication packaging.  Prior to adding Tylenol, please ensure that other pain medications do not contain tylenol.   - Gabapentin: Please take Gabapentin (neurontin) as instructed three times per day, since this medication works best when taken as scheduled, not as needed. Gabapentin should not be stopped abruptly as it may cause withdrawal. Use of this medication will help pain control by a different mechanism than narcotics and also help you wean off the  narcotics more quickly. Most patients will take this med for 2-4 weeks.  To wean off of medication, stop taking the mid-day dose for 3 days, then stop taking the Morning dose for 3 days, and finally, stop taking the evening dose.   - Narcotics: (Oxycodone/Dilaudid/Hydrocodone/Tramadol/etc.): This class of medications are prescribed opioid medications and should only be taken as instructed. It is also important to be on a consistent bowel regimen (see below) when taking narcotic medications, as constipation is a common side effect. If prescribed a dose range, recommend taking the lowest dose for moderate pain and the higher dose for severe pain. Most patients will take these  medications for about a week, possibly two weeks and Narcotics should be the first medication that you wean off of taking. It is important that you DO NOT operate a motor vehicle or make important decisions while taking these meds.  Finally, avoid consuming alcohol or other medications that are sedating or could affect your mental status while taking narcotics.  - Bowel Regimen: Take stool softeners (ex. Colace) as long as you are taking narcotic pain medicines. If you are unable to have a bowel movement every 2-3 days, please proceed with over the counter laxatives (Senokot -S/Miralax), enemas, or suppositories for constipation relief.  - NSAIDs: (Motrin, Advil, Ibuprofen, Naprosyn, Aleve) - *If you are enrolled in NSAID study, please follow study instructions above depending on if you are in YES NSAID arm of the study vs the NO NSAID arm of the study*.  NSAIDs can be found over the counter.  There are occasional fractures that are notorious for slow healing, in which case you will be advised against using this class of medications. Adults: Recommend Ibuprofen 600mg  every 8 hours for the first 2-3 weeks (taken with food) then you may wean as tolerated. Discontinue if you experience stomach upset. Children: Recommend using Motrin dosed per the child's weight as instructed on the packaging. If you have Kidney problems, DO NOT take this medication until discussing with your primary care provider.  These medicines cannot be taken if you are pregnant.   Stomach Ulcer Prevention: Ongoing use of NSAID medications can be very irritating to your stomach, potentially leading to gastritis or ulcers.  While taking NSAID's, we recommend that you also take Nexium (Esomeprazole) 20mg  or Prevacid (Lansoprazole) 15 mg daily while on NSAID's, both of which are over the counter medications.  Blood Clot Prevention:  - You may have been prescribed a blood thinner such as Lovenox (Enoxaparin), Heparin, Aspirin, or Xarelto (Rivaroxaban)  to reduce the risk of a blood clot (deep vein thrombosis; DVT).  The type of medicine and length or preventative treatment is based on you injury type and procedure performed.   - Typically for pelvis and hip fractures, lovenox is prescribed for 3 weeks followed by a baby aspirin (81 mg) by mouth twice per day for 3 more weeks.  - Typically for femur (thigh), knee, lower leg, ankle, and foot fractures, baby aspirin (81 mg) twice per day for 3 weeks is prescribed.  - Patients with additional injuries (head, chest, spine, abdomen), wheelchair bound for long periods of time, bleeding disorders, and other medical problems may have adjusted medications and doses.  - If you have been prescribed lovenox and are not able to fill it, we suggest that you take 1 baby aspirin (81 mg) by mouth twice daily (every 12 hours).     Diet:  -  Please resume your normal diet. It is common to  have a decreased appetite for the first 1-2 weeks or following your injury/surgery. This may be due to pain and the pain medications that you are taking. Please be sure to drink plenty of fluids during this time to remain hydrated.     X-Rays (Radiology Studies):   - If you obtain x-rays (or MRI or CT scans) anywhere other than our office, please make sure to bring the actual images (on a CD) to the office visit. A radiology report is not adequate - your provider must visualize the actual images to accurately determine the plan of care.   - Prior to your first clinic visit, please call your insurance and/or our office to ensure that you have insurance coverage to obtain x-rays in our office. If you are unable to obtain them in our office at your clinic visit, we will assist you in finding an imaging facility that accepts your healthcare plan.    Physical/Occupational Therapy:  - Physical or occupational therapy may be prescribed for rehabilitation during your recovery. The [facility] are all part of the same healthcare system as  [facility] and our office. These centers accept most health insurance plans, but we encourage checking with your insurance plan to ensure appropriate coverage.    We hope that you have experienced excellent care from our team at [facility]. We continuously strive to improve and offer state of the art and world class care to our patients.  If you would like to express your gratitude or make a donation to support [facility]'s efforts, please contact us at: [phone] or [email or web address].    For emotional support resources after your traumatic injury, please contact the [facility] Coordinator, Adele Schilder, at [phone] or [email or web address]       [place] MyChart - Take control of your medical records    MyChart allows you immediate, secure and confidential access to your medical records.  Patients like you use MyChart's Electronic Medical Record for better access to:  Appointment Scheduling - Make an appointment with your existing provider and manage your appointments.  After-Visit Summaries - View a summary of your appointments with instructions and comments from your provider.  Communicate with Your Provider's Team - Correspond directly with your provider about your health or refill a prescription anywhere/anytime.      Sign up using three easy steps:   Please go to [email or web address]           Click on Sign up now to access the new member sign up page  Enter your unique MyChart Access Code exactly as it appears below to complete the sign-up process.  If you experience issues with activating your account, the information you have provided does not completely match our records. Please contact your provider's office or any Hospital Registration Department to correct the information required.   MyChart Access Code: [email or web address]  Expires: 06/24/2023  3:23 PM      Having technical trouble with MyChart?   Call  and select "4" to speak to a representative.  [phone])           ___________________________________________________________________      INSTRUCCIONES DE ALTA PARA LA CIRUGA DE ATENCIN AGUDA POR TRAUMA    Su equipo de administracin principal durante su estada fue el equipo de [place] de Cuidados Intensivos de Trauma (TACS, por sus siglas en ingls). Nuestra oficina/direccin postal es:      Servicios de atencin de trauma agudo  [address]  [address], [address]  [place], [place] [address]  Telfono: [phone] Fax: [phone]      Informacin de seguimiento:  No todos los pacientes necesitan seguimiento con nuestros cirujanos de trauma. Si se le recomienda hacerlo, llame al [phone] dentro de las 48 horas posteriores a su alta del hospital, centro de rehabilitacin aguda o centro de enfermera especializada. Nuestra clnica de trauma se lleva a cabo los martes.    Necesita hacer una cita para ser visto en la clnica de trauma?: S. Se recomienda que realice un seguimiento ambulatorio en la Clnica de Traumatologa por sus fracturas de costillas y neumotrax. Desafortunadamente, si no tiene seguro mdico, deber pagar un copago de $175.  Asista a su cita de seguimiento en la clnica de traumatologa programada para el martes 3 de septiembre de 2024 a las 2:30 p. m. (llegue antes de las 2:15 p. m.) con Neomia Dear radiografa de trax repetida realizada ANTES    IMAGENOLOGA:  Necesitar una radiografa repetida, temprano en la maana de su cita en la clnica de traumatologa. El pedido se realiz dentro del sistema informtico de [place].  Llame a [employer] para programar su imagenologa [phone].    Dieta: Recomendaciones:  Slidos: blandos y del tamao de un bocado (IDDSI SB6)  Lquidos: lquidos ligeros (IDDSI TN0) por [place] oral, cualquier modalidad  Medicamentos: por [place] oral, enteros, con lquido    Precauciones:  Precauciones/compensaciones: alerta y despierto,  posicin erguida, marcapasos externo  Supervisin: supervisin individual    Actividad: soportar peso segn lo tolere; actividad segn lo tolere; Medical illustrator objetos pesados o actividades extenuantes; se recomienda caminar; se recomienda estar fuera de la cama/sentado erguido durante el da  - Caminar con frecuencia  - Contine usando el espirmetro de incentivo 10 veces por hora mientras est despierto, su objetivo es 2000 ml  - Es importante toser y Industrial/product designer profundamente, esto ayuda a prevenir la neumona, que es el mayor factor de riesgo de las costillas rotas.  - Regrese a la sala de emergencias si tiene dificultad para respirar cada vez ms intensa, fiebre o empeoramiento del [employer] en el pecho    Actividades que debe y no debe hacer  La recuperacin generalmente demora entre 3 y 12 semanas. A menos que su  mdico le indique lo contrario, durante este tiempo siga las pautas  generales que se indican a continuacin.    NO realice las siguientes actividades:   No levante ms de 10 libras. En otras palabras, no levante nada que pese ms de un galn de [place].   No empuje ni tire de nada pesado. Por ejemplo, no pase la aspiradora, no corte el csped ni palee.   No realice actividades que puedan causar lesiones, como deportes de  contacto o ejercicios de alto impacto. Por ejemplo, evite:  - Ftbol, baloncesto o lucha libre  - Deportes que requieran casco  - Senderismo, ciclismo o correr  - Technical sales engineer a caballo o andar en vehculos todo terreno  - Aerbicos, abdominales o sentadillas    HAGA las siguientes actividades:   Camine, haga ejercicios de bajo impacto y reanude sus actividades diarias normales    Seales de advertencia: cundo buscar ayuda  Si tiene alguno de los sntomas a continuacin, especialmente si  estn empeorando, llame al Servicio de Insurance claims handler o vaya  al departamento de emergencias ms cercano:   Mareos o aturdimiento   Aumento del dolor en el abdomen   Hinchazn en el abdomen o sensacin de  saciedad  Fiebre de 102 F o ms   Nuseas o vmitos que no mejoran   Dificultad para evacuar los intestinos o estreimiento  que no mejora      Instrucciones especiales adicionales al momento del alta: Evite los [employer] ibuprofeno, Advil, Motrin, Naproxeno, Aleve y aspirina hasta que tenga una cita de seguimiento con la clnica de traumatologa debido a su lesin renal derecha. Est bien tomar Tylenol de venta libre.    - Durante su hospitalizacin, su nivel de sodio en sangre disminuy. Se recomienda que tenga una cita de seguimiento ambulatorio con un mdico de atencin primaria dentro de la semana posterior al alta para un control posterior a la hospitalizacin. Contine tomando sus tabletas de sal en su casa y limite su consumo de agua a menos de 1 Astronomer. Se recomiendan bebidas como Gatorade, Powerade y jugos en [address]    - Lo han derivado a [facility] para una cita posterior a la hospitalizacin con un proveedor de Marine scientist. Esta clnica se especializa en personas que no tienen seguro y no tienen un proveedor de atencin primaria:  Llame para programar su cita dentro de la semana posterior al alta.    Heridas/Incisiones Quirrgicas:  Se le darn instrucciones especficas sobre cmo cuidar su herida. En general, los apsitos se pueden quitar 48 horas despus de la operacin o el alta y puede ducharse. No remoje sus incisiones. Si la herida se enrojece, se calienta o comienza a drenar lquido, o si comienza a Scientist, research (physical sciences) o Teacher, music en el sitio, comunquese con nuestra oficina.    Formularios que requieren Technical brewer de un proveedor:  Si tiene formularios de Dispensing optician, formularios de International aid/development worker y Community education officer) o formularios de compensacin laboral que su proveedor debe Warden/ranger, enve los formularios por correo o fax a Animator oficina a la direccin postal que se indica arriba.    Analgsico:  NO conduzca ni opere maquinaria mientras toma  analgsicos  Evite beber alcohol mientras toma analgsicos    El servicio de trauma puede manejar su dolor general postoperatorio o postraumtico. Si su dolor se debe a lesiones o a una operacin que fue manejada o realizada por otro equipo de [place] o Agricultural consultant, como ortopedia, neurociruga o ciruga de columna, le rogamos que llame al consultorio de ese cirujano para atender sus necesidades de Engineer, mining. Si tiene un seguimiento con el servicio de Insurance claims handler, llame a la oficina de Insurance claims handler. Planifique en consecuencia, ya que las recargas pueden demorar entre 48 y 72 horas.    Es importante tomar sus medicamentos a tiempo y nunca tomar ms de la cantidad Chief of Staff.   Tmelo con comida para Acupuncturist   Los medicamentos tardan en hacer efecto, hasta 20-30 minutos para hacer efecto    Si el dolor disminuye, intente tomar su medicamento para el dolor con menos frecuencia.   Elimine una dosis de medicamento para Chief Technology Officer en un momento del da en que experimente menos dolor   Inicie el uso de Tylenol a medida que su dolor disminuya, hasta que ya no necesite tomar ningn medicamento para Chief Technology Officer     El estreimiento es un efecto secundario comn de los analgsicos   Debe estar tomando un ablandador de heces (p. ej., Colace) mientras est tomando cualquier analgsico narctico.   Coma muchas frutas y verduras y [place] hidratado   Los laxantes y enemas de venta libre estn disponibles en su farmacia local  Si tiene [employer], comunquese con [place], enfermera orientadora de trauma de [place], al [phone] o [email or web address], de lunes a viernes de 8 a.m. a 4 p.m.              [facility] - Neurociruga  [address]  [place], [place] [address]  Telfono: [phone] y Fax: [phone]    INSTRUCCIONES PARA EL ALTA DE NEUROCIRUGA TRAS UNA LESIN DE LA COLUMNA VERTEBRAL NO OPERATORIA:    Neurocirujano: Dr. Sherril Croon    -Necesitar  una cita de seguimiento en 6 semanas con Malachi Carl NP.    IMAGENOLOGA:  -Necesitar una radiografa repetida de la columna vertebral, que se realizar entre 1 y [address] antes de su cita con el neurocirujano.  -Llame a [facility] [phone] o a [facility] para programar su cita de imgenes [phone].    Instrucciones para fractura de espalda o cuello sin ciruga    EXPECTATIVAS:  Llmenos si los sntomas empeoran repentinamente.    MEDICAMENTOS:  Evite tomar nicotina, esteroides, AINE/Aleve/ibuprofeno/Toradol/celebrex en las primeras 12 semanas despus de la fractura de columna. Tome Tylenol para Chief Technology Officer.    FUMAR:  Fumar retrasa el proceso de curacin; le solicitamos que evite fumar si es posible.    ACTIVIDAD:  No se agache ni levante objetos que pesen ms de 5 libras. Es muy importante levantarse y [employer] en casa, pero es igualmente importante no exagerar con las [place]. Planifique recibir [place] en [facility] [facility]. Podr caminar, tomar escaleras (con cuidado y lentamente), usar el bao y Actor de estar sentado a estar de pie usando los brazos de la silla como apoyo. Aumente lentamente su actividad hasta volver a la normalidad. Por ejemplo, aumente de una caminata corta de menos de 5 minutos los [address] a una vuelta a la [place] y [place] 15-20 minutos despus de una semana. No se doble, tuerza ni incline para levantar objetos o cambiar de posicin. Recuerde la buena mecnica de la espalda y use las piernas para sentarse y Actor.  Un menor movimiento del hueso fracturado permite una mayor probabilidad de curacin.  En [employer], se recomiendan actividades que [address] la columna bien [place] y minimicen el movimiento del hueso fracturado.    FISIOTERAPIA:  Hablaremos sobre la fisioterapia en su primera visita de seguimiento. No comenzar la terapia hasta despus de su primera cita de seguimiento.    ACTIVIDAD SEXUAL:  No se recomienda  la actividad sexual hasta que haya realizado un seguimiento con nosotros en la clnica.    CONDUCCIN:  Si tuvo una fractura en la espalda, puede volver a conducir despus de 1 semana. En el caso de una fractura de cuello, debe esperar hasta que haya realizado un seguimiento en la clnica y ya no tenga el collarn cervical.    TRABAJO:  No regrese al Marisa Cyphers que el personal se lo haya recomendado. Generalmente, recomendamos tomarse 4 semanas de descanso del trabajo para recuperarse, pero cada situacin individual se revisar de forma independiente.    TAREAS DEL HOGAR:  Evite pasar la aspiradora o lavar la ropa hasta que haya realizado el seguimiento en la clnica. El movimiento de agacharse y Lexicographer objetos puede ejercer presin sobre la espalda y el cuello.    PROBLEMAS O INQUIETUDES:  No dude en llamar a la oficina en cualquier momento si tiene problemas, inquietudes o preguntas. Siempre estamos disponibles para ayudarlo o responder una pregunta. En caso  de emergencia, llame al 911 o vaya al Departamento de Emergencias ms cercano.    Nuestra oficina est abierta de lunes a viernes de 08:00 a 16:30 h. Si se comunica con la oficina despus de las 16:30 h, se le dirigir al servicio fuera de horario, que ser atendido por un mdico residente de neurociruga para pacientes internados o un asistente mdico.              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 Emiliano Dyer. Deber ser examinado aproximadamente 2 semanas despus de recibir el  alta del hospital. - Por favor, planee llegar al menos 15 minutos antes de la hora de su cita para tener tiempo para Scientist, product/process development y English as a second language teacher.  - Si es necesario, obtendremos radiografas en su primera cita de seguimiento ortopdico. No es necesario realizar radiografas antes de su cita a menos que tenga ciertos tipos de seguro o se le haya indicado que lo haga (si tiene mltiples lesiones). Si tiene preguntas, comunquese con nuestra oficina para obtener orientacin.  - Si se someti a Chief Technology Officer, se examinar su incisin en su primera cita de seguimiento y, por lo general, se quitarn las grapas/suturas.  - Consulte con su seguro para ver si es necesaria una derivacin antes de su primera visita para evitar demoras.  - Su seguro est indicado como: Pagador: / . Comunquese con nuestra oficina si es necesario cambiar esto.    Estado de Sport and exercise psychologist de peso:  Tax adviser de peso segn lo tolere la extremidad superior derecha    Cuidado de la herida:  - Si nota un aumento de Administrator, arts, enrojecimiento de la incisin o mal olor de la incisin/herida y/o tiene Teacher, English as a foreign language, informe al consultorio de inmediato y/o dirjase directamente al Departamento de Emergencias para recibir atencin inmediata.  - Vendajes blandos (sin frula ni yeso): puede ducharse 3 das despus de la ciruga (sin baos ni incisin sumergida) a menos que se le indique lo contrario. No frote la incisin y squela con una toalla limpia. Puede cambiar los vendajes segn sea necesario con gasa y cinta adhesiva limpias o vendajes ACE, a menos que se le den instrucciones especficas.    Medicamentos:  - Para minimizar el uso de narcticos y Ship broker un mejor control del dolor, empleamos un enfoque multimodal del dolor. La dosis de los medicamentos variar de un paciente a otro en funcin de sus necesidades especficas, alergias e historial mdico, por lo que consulte la lista de medicamentos de alta. - El protocolo de control del dolor utiliza varios  medicamentos, todos ellos enumerados a continuacin:    - Tylenol: Tylenol (acetaminofn) se puede adquirir sin receta. Es importante limitar la dosis a no ms de 3000 mg en un perodo de 24 horas. Se recomienda a los pacientes con problemas hepticos que se abstengan de tomarlo hasta que lo hayan discutido con su mdico de cabecera. La dosis habitual para adultos es de 1000 mg cada 8 horas durante las primeras 2-3 semanas y, luego, segn sea necesario. Para los nios, recomendamos una dosis basada en  el peso segn las instrucciones del envase del [place]. Antes de agregar Tylenol, asegrese de que otros analgsicos no contengan Tylenol.  - Gabapentina: tome gabapentina (neurontin) segn las instrucciones tres veces al da, ya que este medicamento funciona mejor cuando se toma segn lo programado, no segn lo necesario. No se debe suspender la gabapentina de [place], ya que puede causar abstinencia. El uso de este medicamento ayudar a Human resources officer mediante un mecanismo diferente al de los narcticos y tambin lo ayudar a Animal nutritionist de tomarlos ms rpidamente. La mayora de los pacientes tomarn este medicamento durante 2 a 4 semanas. Para dejar de tomarlo, deje de tomar la dosis del medioda durante [address], luego deje de tomar la dosis de la maana durante 3 [address] y, por ltimo, deje de tomar la dosis de la noche.  - Narcticos: (Oxicodona/Dilaudid/Hidrocodona/Tramadol/etc.): Esta clase de medicamentos son medicamentos opioides recetados y solo deben tomarse segn las instrucciones. Tambin es importante seguir un rgimen intestinal constante (ver a continuacin) cuando se toman medicamentos narcticos, ya que el estreimiento es un efecto secundario comn. Si se prescribe un rango de dosis, recomiende tomar la dosis ms baja para el dolor moderado y la dosis ms alta para el dolor intenso. La [employer] de los pacientes tomarn estos medicamentos durante aproximadamente una semana, [employer], y los narcticos deben ser Financial risk analyst medicamento que deje de tomar. Es importante que NO conduzca un vehculo motorizado ni tome decisiones importantes mientras toma estos medicamentos. Por ltimo, evite consumir alcohol u otros medicamentos que sean sedantes o que puedan afectar su estado mental mientras toma narcticos. - Rgimen intestinal: tome ablandadores de heces (por ejemplo, Colace) mientras est tomando analgsicos narcticos. Si no puede evacuar el intestino cada 2 o 3 das, tome laxantes de venta libre (Senokot -S/Miralax), enemas o supositorios para Educational psychologist.  - AINE: (Motrin, Advil, Ibuprofeno, Naprosyn, Aleve) - *Si est inscrito en un estudio sobre [place], siga las instrucciones del estudio anteriores segn est en el grupo de AINE S o en el grupo de AINE NO*. Los AINE se pueden adquirir sin Engineer, drilling. Hay fracturas ocasionales que son conocidas por su lenta curacin, en cuyo caso se le desaconsejar el uso de esta clase de medicamentos. Adultos: se recomienda tomar ibuprofeno 600 mg cada 8 horas durante las primeras 2-3 semanas (con alimentos) y luego puede dejar de tomarlo segn lo tolere. Suspenda el tratamiento si experimenta Programme researcher, broadcasting/film/video. Nios: se recomienda usar Motrin dosificado segn el peso del nio, segn las instrucciones del envase. Si tiene [employer], NO tome este medicamento hasta consultarlo con su mdico de cabecera. No puede tomar estos medicamentos si est embarazada.  ? Prevencin de lceras de estmago: el uso continuo de medicamentos AINE puede ser muy irritante para el estmago y puede provocar gastritis o lceras. Mientras toma AINE, le recomendamos que tambin tome Nexium (esomeprazol) 20 mg o Prevacid (lansoprazol) 15 mg por da, ambos medicamentos de [employer].  Prevencin de cogulos sanguneos:  - Es posible que Personnel officer recetado un anticoagulante como Lovenox (enoxaparina), heparina, aspirina o Xarelto (rivaroxabn) para reducir el  riesgo de un cogulo sanguneo (trombosis venosa profunda; TVP). El tipo de medicamento y la duracin del tratamiento preventivo dependen del tipo de lesin y del procedimiento realizado.  - Por lo general, para las fracturas de pelvis y cadera, se prescribe Lovenox durante 3 semanas seguido de una aspirina infantil (81 mg) por [place] oral dos veces al da durante 3 semanas ms.  -  Por lo general, para fracturas de fmur (muslo), rodilla, parte inferior de la pierna, tobillo y pie, se prescribe aspirina infantil (81 mg) dos veces al da durante 3 semanas.  - A los pacientes con lesiones adicionales (cabeza, pecho, columna, abdomen), en silla de ruedas durante perodos prolongados, trastornos hemorrgicos y otros problemas mdicos se les pueden ajustar los medicamentos y las dosis.  - Si le han recetado Lovenox y no puede completarlo, le sugerimos que tome 1 aspirina infantil (81 mg) por [place] oral dos veces al da (cada 12 horas).    Dieta:  - Reanude su dieta normal. Es comn tener menos apetito durante las primeras 1 o 2 semanas o despus de su lesin/ciruga. Esto puede deberse al [employer] y a los analgsicos que est tomando. Asegrese de beber mucho lquido durante este tiempo para mantenerse hidratado.    Radiografas (estudios de Insurance account manager):  - Si se realiza radiografas (o resonancias magnticas o tomografas computarizadas) en cualquier otro lugar que no sea nuestro consultorio, asegrese de Midwife las imgenes reales (en un CD) a la visita al [facility]. Un informe radiolgico no es suficiente; su proveedor debe visualizar las imgenes reales para determinar con precisin el plan de atencin.  - Antes de su primera visita a la clnica, llame a su seguro o a nuestro consultorio para asegurarse de que tiene cobertura de seguro para obtener radiografas en nuestro consultorio. Si no puede obtenerlas en nuestro consultorio en su visita a la clnica, lo ayudaremos a Community education officer de diagnstico por imgenes que  acepte su plan de atencin mdica.    Fisioterapia/terapia ocupacional:  - Es posible que se le recete fisioterapia o terapia ocupacional para la rehabilitacin durante su recuperacin. Los centros de fisioterapia de [place] son parte del mismo sistema de atencin mdica [facility] y [employer]. Estos centros aceptan la [employer]son de los planes de seguro mdico, pero le recomendamos que consulte con su plan de seguro para asegurarse de tener la [place].    Esperamos que haya experimentado una excelente atencin por parte de nuestro equipo en [facility]. Nos esforzamos continuamente por mejorar y ofrecer atencin de vanguardia y de primera clase a nuestros pacientes. Si desea expresar su gratitud o hacer una donacin para apoyar los esfuerzos de [place], [place] con nosotros al: [phone] o [email or web address].    Para obtener recursos de apoyo emocional despus de una lesin traumtica, comunquese con la coordinadora de la red de sobrevivientes de traumatismos de [place], al [phone] o [email or web address]

## 2023-05-02 NOTE — Procedures (Signed)
Insert Arterial Line    Date/Time: 05/02/2023 5:32 PM    Performed by: Clydell Hakim, MD  Authorized by: Perlie Mayo, MD  Consent: Written consent obtained.  Risks and benefits: risks, benefits and alternatives were discussed  Consent given by: power of attorney  Patient understanding: patient states understanding of the procedure being performed  Patient consent: the patient's understanding of the procedure matches consent given  Procedure consent: procedure consent matches procedure scheduled  Relevant documents: relevant documents present and verified  Required items: required blood products, implants, devices, and special equipment available  Patient identity confirmed: arm band  Time out: Immediately prior to procedure a "time out" was called to verify the correct patient, procedure, equipment, support staff and site/side marked as required.  Preparation: Patient was prepped and draped in the usual sterile fashion.  Indications: hemodynamic monitoring  Location: left radial  Anesthesia: local infiltration    Anesthesia:  Local Anesthetic: lidocaine 1% without epinephrine  Anesthetic total: 2 mL    Sedation:  Patient sedated: yes    Allen's test normal: yes  Needle gauge: 18  Seldinger technique: Seldinger technique used  Number of attempts: 2  Post-procedure: line sutured and dressing applied  Post-procedure CMS: normal        Attestation:    I was present for and directed key portions of this procedure.    Willette Brace MD, [facility]  Trauma/Acute Care Surgery

## 2023-05-02 NOTE — Plan of Care (Signed)
Problem: Non-Violent Restraints Interdisciplinary Plan  Goal: Will be injury free during the use of non-violent restraints  Outcome: Progressing  Flowsheets (Taken 05/02/2023 1058)  Will be injury free during the use of non-violent restraints:   Attempt all alternatives before use of restraints   Notify family of initiation of restraints   Nurse to accompany patient off unit when on restraints   Remove restraints before the indicated maximum length of time when meets criteria for discontinuation   Reassess need for continued restraints   Ensure that order for restraints has not expired   Provide debriefing as soon as possible and appropriate
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
## 2023-05-02 NOTE — Plan of Care (Signed)
Problem: Non-Violent Restraints Interdisciplinary Plan  Goal: Will be injury free during the use of non-violent restraints  Outcome: Progressing     Problem: Moderate/High Fall Risk Score >5  Goal: Patient will remain free of falls  Outcome: Progressing
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
## 2023-05-02 NOTE — Progress Notes (Signed)
NEUROSURGERY ICU PROGRESS NOTE    Date Time: 05/02/23 4:38 AM  Patient Name: ZOXWRU,045 B  Consulting Attending Physician: Dr. Sherril Croon   Covered By: Team B: [phone]    Assessment:   [name] is a 52 y.o. unknown with PMH unknown, p/w small L frontal convexity tSAH + C2 avulsion fx s/p ped struck. Neurologically, non-focal.    Plan:   - Primary medical management per trauma  - q1h neurochecks  - Maintain normothermia, normoglycemia  - Imaging:  - No further routine imaging from neurosurgical perspective  - Activity/Positioning:  - No indication for C-spine collar  - HOB >30 degrees  - Medications:  - Multimodal pain control with QD bowel regimen.  - No therapeutic anticoagulants/antiplatelets/NSAIDs  - Consults:  - PT/OT eval and treat  - DVT Prophylaxis:  - SCDs  - Chemo DVT PPx: hold in the setting of intracranial hemorrhage  - Wound Care:  - Local wound care PRN    No indication for acute neurosurgical intervention at this time.  Please call with further questions, neuro decline, or new neuroimaging requiring our review.  Please follow up in 6 weeks in outpatient neurosurgery clinic with Malachi Carl NP.    Plan formulated and discussed with Dr. Sherril Croon.     Interim History:     24h Events:   - NAEO  - Afeb, mild tachycardia (improved), tachypnea  - AUOP, voiding via foley, + emesis  - Pain controlled  Last WBC: 14  Last Plts: 204    Physical Exam:     Vitals:    05/02/23 0437   BP:    Pulse:    Resp:    Temp:    SpO2: 100%       Intake and Output Summary (Last 24 hours) at Date Time    Intake/Output Summary (Last 24 hours) at 05/02/2023 0438  Last data filed at 05/02/2023 0400  Gross per 24 hour   Intake 3923.5 ml   Output 1210 ml   Net 2713.5 ml       General Appearance: NAD  HEENT: Atraumatic.   CV: Hemodynamically stable, appears well-perfused  Pulm: Normal effort, equal chest rise.  GI: Soft. Rectal tone intact (baseline, and when bearing down)  Back: Well-aligned, no obvious step-off deformities. No tenderness to  palpation.  Ext: No edema  Skin: No wounds    NEURO:    Mental Status: A&Ox4. Able to fully participate in examination. Fluent, spontaneous speech. Comprehension intact. Follows commands with midline cross. Attends to interviewer.     Cranial Nerves: II-XII grossly intact and symmetric bilaterally.     Motor:  Normal bulk and tone throughout. No abnormal movements.  Pronator Drift: none    UE  Deltoid (C5) Bicep (C5)  Tricep  (C7) WE (C6) Grip (C8)   R  Uta* Uta* Uta* Uta* Uta*   L 5/5 5/5 5/5 5/5 5/5   *Limited by ortho injury    LE  Hip F  (L2) Knee E (L3) DF (L4)  EHL (L5) PF  (S1)    R  5/5 5/5 5/5 5/5 5/5   L 5/5 5/5 5/5 5/5 5/5     Sensory: Sensation intact to LT throughout.     Cerebellar: Not tested    Gait: Deferred.      Medications:     Current Facility-Administered Medications   Medication Dose Route Frequency    acetaminophen  1,000 mg Intravenous Q6H    folic acid  1 mg  Oral Daily    levETIRAcetam  500 mg Oral Q12H SCH    Or    levETIRAcetam  500 mg Intravenous Q12H SCH    methocarbamol (ROBAXIN) 1,000 mg in dextrose 5 % 250 mL IVPB  1,000 mg Intravenous Q8H    multivitamin  1 tablet Oral Daily    PHENobarbital  65 mg Intravenous Q8H SCH    Followed by    Melene Muller ON 05/04/2023] PHENobarbital  32.5 mg Intravenous Q8H SCH    propranolol  20 mg Oral Q8H SCH    senna-docusate  2 tablet Oral Q12H SCH    thiamine  500 mg Intravenous Q8H SCH    Followed by    Melene Muller ON 05/05/2023] thiamine  250 mg Intravenous Q24H SCH    Followed by    Melene Muller ON 05/08/2023] thiamine  200 mg Oral Daily       Labs:     Lab Results   Component Value Date    WBC 13.99 (H) 05/02/2023    HGB 11.6 (L) 05/02/2023    HCT 35.5 (L) 05/02/2023    MCV 97.3 (H) 05/02/2023    PLT 204 05/02/2023     Lab Results   Component Value Date    NA 138 05/01/2023    K 3.9 05/01/2023    CL 105 05/01/2023    CO2 16 (L) 05/01/2023     Lab Results   Component Value Date    INR 1.1 05/01/2023    PT 12.6 05/01/2023       Rads:     Radiology Results (24 Hour)        Procedure Component Value Units Date/Time    XR Abdomen Portable [[id number]] Resulted: 05/02/23 0017    Order Status: Sent Updated: 05/02/23 0017    CT Angiogram Chest [[id number]] Collected: 05/01/23 2257    Order Status: Completed Updated: 05/01/23 2333    Narrative:      HISTORY:  Injury to the chest abdomen and pelvis    COMPARISON: None available.    TECHNIQUE: CT angiogram of the chest, CT of the abdomen and pelvis  performed with intravenous contrast.     Multiplanar reformatted and 3D maximum projection intensity (MIP)  reconstruction images of the chest were created and reviewed.     The following dose reduction techniques were utilized: automated exposure  control and/or adjustment of the mA and/or KV according to patient size,  and the use of an iterative reconstruction technique.    CONTRAST: iohexol (OMNIPAQUE) 350 MG/ML injection 150 mL    FINDINGS:    CHEST:     Lungs: Dependent airspace disease.    Pleura: Small right hemothorax. Small to moderate right pneumothorax.    Airways: Patient is intubated. Central airways are otherwise patent.    Mediastinum: No pneumomediastinum or hematoma.    Heart: Mild cardiomegaly. No abnormal pericardial effusion or thickening.  Coronary atherosclerosis.    Vessels: The great vessels are non-dilated. The right vertebral artery is  not visualized. No evidence of thoracic aortic injury.    No pulmonary embolism.    ABDOMEN/PELVIS:    Hepatobiliary: Mild hepatic steatosis. No liver injury. Patent portal and  hepatic veins. Unremarkable CT appearance of the gallbladder. No abnormal  biliary distension.    Pancreas: Unremarkable. No abnormal pancreatic ductal dilation.    Spleen: Unremarkable.    Adrenals: 1.5 cm low-density right adrenal nodule, most likely adenoma.    Kidneys and ureters: 3 cm parenchymal laceration of the right upper  pole  kidney. Irregularity of the right renal vein at the hilum suspicious for  renal vein injury. Small right perinephric and  retroperitoneal hematoma  without active extravasation.    No hydronephrosis.    Bladder: Unremarkable.    Reproductive organs: Unremarkable.    Gastrointestinal: No bowel obstruction. No abnormal bowel wall thickening.  Normal appendix.    Peritoneum/retroperitoneum: Small right retroperitoneal hematoma. No  loculated fluid collection. No free intraperitoneal air.    Lymph nodes: No lymphadenopathy.    Vessels: The abdominal aorta is non-dilated. Accessory right renal artery.    Musculoskeletal:     Numerous acute rib fractures as below:    *Displaced right 6-7 lateral ribs  *Displaced right 6-12 posterior ribs, the eighth posterior rib fracture is  comminuted  *The right 6-9 ribs are fractured at 2 locations    Chronic nondisplaced fractures of the left 4-7 anterior ribs.    Nondisplaced fractures of the bilateral superior and inferior pubic rami.    Superior dislocation of the right sternoclavicular articulation with  regional soft tissue contusion. Likely nondisplaced fracture of the medial  aspect of the right clavicle.    Soft tissues: Right flank soft tissue contusion with small hematoma. No  active extravasation.    Right chest wall soft tissue gas and contusion adjacent to rib fractures.     Indistinctness of the medial right diaphragm with adjacent hematoma.     Soft tissue gas in the right submandibular region with probable injury to  the right submandibular gland.       Impression:           1.Multiple right-sided rib fractures with 6-9 flail ribs.  2.Small to moderate right pneumothorax and small right hemothorax.  3.AAST great 4 right renal injury with a 3 cm parenchymal laceration,  suspicious right renal vein injury at the renal hilum, and a small  perinephric and retroperitoneal hematoma. No active extravasation.  4.Indistinctness of the medial right diaphragm with adjacent hematoma,  suspicious for diaphragmatic injury.  5.Nondisplaced fractures of the bilateral superior and inferior pubic  rami.  6.Superior dislocation of the right sternoclavicular articulation with  likely nondisplaced fracture of the medial right clavicle.  7.Nonvisualization of the right vertebral artery suspicious for dissection.  Please refer to the separately dictated CTA.  8.Right flank soft tissue contusion with small hematoma.  9.Soft tissue gas in the right submandibular region with probable injury of  the submandibular gland.    Urgent results were discussed with and acknowledged by Burt Knack, MD  on 05/01/2023 11:20 PM.    Campbell Stall  05/01/2023 11:31 PM    CT Abd/Pelvis with IV Contrast [[id number]] Collected: 05/01/23 2257    Order Status: Completed Updated: 05/01/23 2333    Narrative:      HISTORY:  Injury to the chest abdomen and pelvis    COMPARISON: None available.    TECHNIQUE: CT angiogram of the chest, CT of the abdomen and pelvis  performed with intravenous contrast.     Multiplanar reformatted and 3D maximum projection intensity (MIP)  reconstruction images of the chest were created and reviewed.     The following dose reduction techniques were utilized: automated exposure  control and/or adjustment of the mA and/or KV according to patient size,  and the use of an iterative reconstruction technique.    CONTRAST: iohexol (OMNIPAQUE) 350 MG/ML injection 150 mL    FINDINGS:    CHEST:     Lungs: Dependent airspace disease.  Pleura: Small right hemothorax. Small to moderate right pneumothorax.    Airways: Patient is intubated. Central airways are otherwise patent.    Mediastinum: No pneumomediastinum or hematoma.    Heart: Mild cardiomegaly. No abnormal pericardial effusion or thickening.  Coronary atherosclerosis.    Vessels: The great vessels are non-dilated. The right vertebral artery is  not visualized. No evidence of thoracic aortic injury.    No pulmonary embolism.    ABDOMEN/PELVIS:    Hepatobiliary: Mild hepatic steatosis. No liver injury. Patent portal and  hepatic veins. Unremarkable CT appearance of the  gallbladder. No abnormal  biliary distension.    Pancreas: Unremarkable. No abnormal pancreatic ductal dilation.    Spleen: Unremarkable.    Adrenals: 1.5 cm low-density right adrenal nodule, most likely adenoma.    Kidneys and ureters: 3 cm parenchymal laceration of the right upper pole  kidney. Irregularity of the right renal vein at the hilum suspicious for  renal vein injury. Small right perinephric and retroperitoneal hematoma  without active extravasation.    No hydronephrosis.    Bladder: Unremarkable.    Reproductive organs: Unremarkable.    Gastrointestinal: No bowel obstruction. No abnormal bowel wall thickening.  Normal appendix.    Peritoneum/retroperitoneum: Small right retroperitoneal hematoma. No  loculated fluid collection. No free intraperitoneal air.    Lymph nodes: No lymphadenopathy.    Vessels: The abdominal aorta is non-dilated. Accessory right renal artery.    Musculoskeletal:     Numerous acute rib fractures as below:    *Displaced right 6-7 lateral ribs  *Displaced right 6-12 posterior ribs, the eighth posterior rib fracture is  comminuted  *The right 6-9 ribs are fractured at 2 locations    Chronic nondisplaced fractures of the left 4-7 anterior ribs.    Nondisplaced fractures of the bilateral superior and inferior pubic rami.    Superior dislocation of the right sternoclavicular articulation with  regional soft tissue contusion. Likely nondisplaced fracture of the medial  aspect of the right clavicle.    Soft tissues: Right flank soft tissue contusion with small hematoma. No  active extravasation.    Right chest wall soft tissue gas and contusion adjacent to rib fractures.     Indistinctness of the medial right diaphragm with adjacent hematoma.     Soft tissue gas in the right submandibular region with probable injury to  the right submandibular gland.       Impression:           1.Multiple right-sided rib fractures with 6-9 flail ribs.  2.Small to moderate right pneumothorax and small  right hemothorax.  3.AAST great 4 right renal injury with a 3 cm parenchymal laceration,  suspicious right renal vein injury at the renal hilum, and a small  perinephric and retroperitoneal hematoma. No active extravasation.  4.Indistinctness of the medial right diaphragm with adjacent hematoma,  suspicious for diaphragmatic injury.  5.Nondisplaced fractures of the bilateral superior and inferior pubic rami.  6.Superior dislocation of the right sternoclavicular articulation with  likely nondisplaced fracture of the medial right clavicle.  7.Nonvisualization of the right vertebral artery suspicious for dissection.  Please refer to the separately dictated CTA.  8.Right flank soft tissue contusion with small hematoma.  9.Soft tissue gas in the right submandibular region with probable injury of  the submandibular gland.    Urgent results were discussed with and acknowledged by Burt Knack, MD  on 05/01/2023 11:20 PM.    Campbell Stall  05/01/2023 11:31 PM    CT 3D Reconstruction  C-spine [[id number]] Collected: 05/01/23 2302    Order Status: Completed Updated: 05/01/23 2315    Narrative:      HISTORY: Neck trauma.     COMPARISON: Concurrent neck CTA (reported separately).    TECHNIQUE: Multiplanar CT images of the cervical spine reconstructed from  the data set obtained during CT angiogram of the neck. The following dose  reduction techniques were utilized: automated exposure control and/or  adjustment of the mA and/or KV according to patient size, and the use of an  iterative reconstruction technique.    FINDINGS:     There are 7 non-rib-bearing cervical vertebral segments.    Overall bony mineralization is within normal limits. Small avulsion  fractures at the tip of the odontoid process at C2 (see coronal series 313  image 22). No other acute fracture or dislocation. No suspicious lytic or  sclerotic lesion.    No evidence of intraspinal mass or hematoma.    Chronic multilevel disc-endplate degeneration from C3 to T1,  causing mild  multilevel spinal canal stenoses, severe right neural foraminal stenosis at  C5-C6, and severe left neural foraminal stenosis at C6-C7. Relatively mild  multilevel facet joint degeneration.    Please see separate report for extraspinal findings within the neck.      Impression:        1.Small avulsion fractures at the tip of the odontoid process at C2 (see  coronal series 313 image 22). Recommend cervical spine MRI for further  evaluation.    Dannielle Burn  05/01/2023 11:14 PM    CT 3D Reconstruction T-spine [[id number]] Collected: 05/01/23 2301    Order Status: Completed Updated: 05/01/23 2305    Narrative:      HISTORY: Trauma.  Posttraumatic low back pain.    COMPARISON: None available.    TECHNIQUE: Multiplanar CT images of the thoracic spine reconstructed from  the data set obtained during CT of the chest. The following dose reduction  techniques were utilized: automated exposure control and/or adjustment of  the mA and/or KV according to patient size, and the use of an iterative  reconstruction technique.    FINDINGS:     The lumbar spine is normally aligned on both the coronal and sagittal  reconstructions. The vertebral body heights are maintained. The facets are  well aligned bilaterally. The central canal contents are unremarkable  within the limits of this modality. There is disc space height loss at  L5-S1 with vacuum phenomenon. The intervertebral disc space heights are  otherwise fairly well-maintained. Partially visualized are multiple  right-sided lower rib fractures. There is an associated right-sided pleural  effusion with dependent bibasilar atelectasis.      Impression:           1. No acute lumbar vertebral fracture, compression deformity or traumatic  malalignment.  2. Multiple right-sided lower rib fractures.    Please see separate report for findings within the chest.    Waynard Edwards, MD  05/01/2023 11:03 PM    CT Head without Contrast [[id number]] Collected: 05/01/23 2251    Order  Status: Completed Updated: 05/01/23 2304    Narrative:      HISTORY: Head trauma.     COMPARISON: None available.    TECHNIQUE: CT of the head performed without intravenous contrast. The  following dose reduction techniques were utilized: automated exposure  control and/or adjustment of the mA and/or KV according to patient size,  and the use of an iterative reconstruction technique.    CONTRAST: None.  FINDINGS:    Small amount of traumatic subarachnoid hemorrhage at the surface of the  left posterior frontal convexity (see axial series 2 image 27). No  herniation or hydrocephalus. No evidence of recent infarct.    There are chronic age-related involutional findings, with mild prominence  of the CSF-containing spaces. There is encephalomalacia at the left  inferior frontal lobe, which may represent chronic sequela of old traumatic  contusion. Bilateral globus pallidus calcifications.    No evidence of hyperdense vascular thrombus. Arteriosclerotic  calcifications at the carotid siphons.    No acute or suspicious lesion in the calvarium or skull base. Moderate  mucosal thickening in the right maxillary sinus.    Right parietal scalp and left periorbital soft tissue contusions/hematomas.  Patient is intubated.      Impression:         1.Small amount of traumatic subarachnoid hemorrhage at the surface of the  left posterior frontal convexity (see axial series 2 image 27).    Dannielle Burn  05/01/2023 11:02 PM    CT Angiogram Neck [[id number]] Collected: 05/01/23 2254    Order Status: Completed Updated: 05/01/23 2304    Narrative:      HISTORY: Trauma. Trauma.     COMPARISON: None available.    TECHNIQUE: CT angiogram of the neck performed with intravenous contrast.  Multiplanar reformatted and 3D maximum intensity projection (MIP) images  were created and reviewed. Proximal internal carotid artery narrowing was  determined utilizing NASCET methodology. The following dose reduction  techniques were utilized: automated  exposure control and/or adjustment of  the mA and/or KV according to patient size, and the use of an iterative  reconstruction technique.    CONTRAST: iohexol (OMNIPAQUE) 350 MG/ML injection 150 mL    FINDINGS:      The right common carotid artery is patent. There is no stenosis of the  proximal right internal carotid artery. The distal cervical right internal  carotid artery is patent.    The left common carotid artery is patent.  There is no stenosis of the  proximal left internal carotid artery. The distal cervical left internal  carotid artery is patent.    left cervical vertebral artery appear patent without evidence of narrowing  or stenosis. Right vertebral artery appear absent, and this is consistent  with either occlusion or hypoplasia/aplasia.    These urgent findings discussed with acknowledged by Burt Knack at  05/01/2023 11:01 PM.      Impression:         1.There is no stenosis of the proximal right internal carotid artery based  on NASCET criteria.  2.There is no stenosis of the proximal left internal carotid artery based  on NASCET criteria.  3.left cervical vertebral artery appear patent without evidence of  narrowing or stenosis. Right vertebral artery appear absent, and this is  consistent with either occlusion or hypoplasia/aplasia.        Alex Einar Pheasant, MD  05/01/2023 11:02 PM    CT 3D Reconstruction L-spine [[id number]] Collected: 05/01/23 2257    Order Status: Completed Updated: 05/01/23 2303    Narrative:      HISTORY: Trauma.  Posttraumatic thoracic back pain.    COMPARISON: None available.    TECHNIQUE: Multiplanar CT images of the lumbar spine reconstructed from the  data set obtained during CT of the abdomen and pelvis. The following dose  reduction techniques were utilized: automated exposure control and/or  adjustment of the mA and/or KV according to patient size, and  the use of an  iterative reconstruction technique.    FINDINGS:     The thoracic spine is normally aligned on both  the coronal and sagittal  reconstructions. The vertebral body heights are maintained. The facets are  well aligned bilaterally. The central canal contents are unremarkable  within the limits of this modality. Diffuse discogenic degeneration is  present without obvious canal or foraminal encroachment. Partially  visualized are multiple right-sided lower rib fractures. There is an  associated right-sided pleural effusion with dependent bibasilar  atelectasis.      Impression:           1. No acute thoracic vertebral fracture, compression deformity or traumatic  malalignment.  2. Multiple right-sided lower rib fractures.    Please see separate report for findings within the abdomen and pelvis.    Waynard Edwards, MD  05/01/2023 11:00 PM    XR Forearm Complete Right [[id number]] Collected: 05/01/23 2255    Order Status: Completed Updated: 05/01/23 2259    Narrative:      HISTORY: trauma. . Injury of the right forearm.    COMPARISON: None available.     FINDINGS:     Comminuted fractures of the mid to distal shafts of the radius and ulna  show significant displacement and mild angulation.    No intra-articular fractures are identified. No dislocation is seen.      Impression:           Acute, comminuted, fractures of the mid to distal shafts of the radius and  ulna with significant displacement and mild angulation of fracture  fragments.          Miguel Dibble, MD  05/01/2023 10:57 PM    XR Pelvis Complete [[id number]] Collected: 05/01/23 2252    Order Status: Completed Updated: 05/01/23 2257    Narrative:      HISTORY: Pelvic injury    COMPARISON: Concurrent CT examination of the abdomen and pelvis.Marland Kitchen    FINDINGS:  There is mild bilateral hip arthritis. Hairline nondisplaced fractures  again seen at the right and left superior pubic rami as well as the left  inferior pubic ramus. Radiopaque focus projects over the left femoral neck.      Impression:        1.Multiple nondisplaced pubic rami fracture injuries (corroborated on  the  concurrent CT studies).    Sable Feil, MD  05/01/2023 10:55 PM    Chest AP Portable [[id number]] Collected: 05/01/23 2248    Order Status: Completed Updated: 05/01/23 2251    Narrative:      HISTORY: Trauma.      COMPARISON: None    FINDINGS:     Portable AP view of the chest shows endotracheal tube terminating 3 cm  above carina. Mild pulmonary vascular congestion is seen. There is  comminuted acute fracture of right lateral sixth and seventh ribs with  subcutaneous emphysema. No pneumothorax is seen. Cardiomediastinal  silhouette is not enlarged.  No focal bony lesion is seen.      Impression:          1. Comminuted fracture of right lateral sixth and seventh ribs with  subcutaneous emphysema    J. Carole Binning, MD  05/01/2023 10:49 PM            Hezzie Bump, MD  Neurosurgery    PGY-3  05/02/23 4:38 AM

## 2023-05-03 ENCOUNTER — Inpatient Hospital Stay: Payer: Medicaid Other

## 2023-05-03 ENCOUNTER — Other Ambulatory Visit (INDEPENDENT_AMBULATORY_CARE_PROVIDER_SITE_OTHER): Payer: Self-pay | Admitting: Orthopaedic Surgery

## 2023-05-03 DIAGNOSIS — S52201A Unspecified fracture of shaft of right ulna, initial encounter for closed fracture: Secondary | ICD-10-CM

## 2023-05-03 DIAGNOSIS — J9601 Acute respiratory failure with hypoxia: Secondary | ICD-10-CM

## 2023-05-03 DIAGNOSIS — S5291XA Unspecified fracture of right forearm, initial encounter for closed fracture: Secondary | ICD-10-CM

## 2023-05-03 LAB — CBC
Absolute nRBC: 0 10*3/uL (ref <=0.00)
Absolute nRBC: 0 10*3/uL (ref <=0.00)
Absolute nRBC: 0 10*3/uL (ref <=0.00)
Hematocrit: 32.2 % — ABNORMAL LOW (ref 37.6–49.6)
Hematocrit: 30.1 % — ABNORMAL LOW (ref 37.6–49.6)
Hematocrit: 30.8 % — ABNORMAL LOW (ref 37.6–49.6)
Hemoglobin: 10.8 g/dL — ABNORMAL LOW (ref 12.5–17.1)
Hemoglobin: 10.2 g/dL — ABNORMAL LOW (ref 12.5–17.1)
Hemoglobin: 10.4 g/dL — ABNORMAL LOW (ref 12.5–17.1)
MCH: 31.4 pg (ref 25.1–33.5)
MCH: 31.9 pg (ref 25.1–33.5)
MCH: 32 pg (ref 25.1–33.5)
MCHC: 33.5 g/dL (ref 31.5–35.8)
MCHC: 33.9 g/dL (ref 31.5–35.8)
MCHC: 33.8 g/dL (ref 31.5–35.8)
MCV: 93.6 fL (ref 78.0–96.0)
MCV: 94.1 fL (ref 78.0–96.0)
MCV: 94.8 fL (ref 78.0–96.0)
MPV: 11 fL (ref 8.9–12.5)
MPV: 10.8 fL (ref 8.9–12.5)
MPV: 11.4 fL (ref 8.9–12.5)
Platelet Count: 165 10*3/uL (ref 142–346)
Platelet Count: 148 10*3/uL (ref 142–346)
Platelet Count: 125 10*3/uL — ABNORMAL LOW (ref 142–346)
RBC: 3.44 10*6/uL — ABNORMAL LOW (ref 4.20–5.90)
RBC: 3.2 10*6/uL — ABNORMAL LOW (ref 4.20–5.90)
RBC: 3.25 10*6/uL — ABNORMAL LOW (ref 4.20–5.90)
RDW: 16 % — ABNORMAL HIGH (ref 11–15)
RDW: 16 % — ABNORMAL HIGH (ref 11–15)
RDW: 16 % — ABNORMAL HIGH (ref 11–15)
WBC: 12.38 10*3/uL — ABNORMAL HIGH (ref 3.10–9.50)
WBC: 10.72 10*3/uL — ABNORMAL HIGH (ref 3.10–9.50)
WBC: 12.05 10*3/uL — ABNORMAL HIGH (ref 3.10–9.50)
nRBC %: 0 /100 WBC (ref <=0.0)
nRBC %: 0 /100 WBC (ref <=0.0)
nRBC %: 0 /100 WBC (ref <=0.0)

## 2023-05-03 LAB — HEPATIC FUNCTION PANEL (LFT)
ALT: 94 U/L — ABNORMAL HIGH (ref 0–55)
AST (SGOT): 166 U/L — ABNORMAL HIGH (ref 5–41)
Albumin/Globulin Ratio: 0.7 — ABNORMAL LOW (ref 0.9–2.2)
Albumin: 2.3 g/dL — ABNORMAL LOW (ref 3.5–5.0)
Alkaline Phosphatase: 56 U/L (ref 37–117)
Bilirubin Direct: 0.7 mg/dL — ABNORMAL HIGH (ref 0.0–0.5)
Bilirubin Indirect: 1.3 mg/dL — ABNORMAL HIGH (ref 0.2–1.0)
Bilirubin, Total: 2 mg/dL — ABNORMAL HIGH (ref 0.2–1.2)
Globulin: 3.2 g/dL (ref 2.0–3.6)
Protein, Total: 5.5 g/dL — ABNORMAL LOW (ref 6.0–8.3)

## 2023-05-03 LAB — BASIC METABOLIC PANEL
Anion Gap: 5 (ref 5.0–15.0)
BUN: 13 mg/dL (ref 9–28)
CO2: 21 mEq/L (ref 17–29)
Calcium: 7.5 mg/dL — ABNORMAL LOW (ref 8.5–10.5)
Chloride: 116 mEq/L — ABNORMAL HIGH (ref 99–111)
Creatinine: 0.8 mg/dL (ref 0.5–1.5)
GFR: >60 mL/min/{1.73_m2} (ref >=60.0)
Glucose: 141 mg/dL — ABNORMAL HIGH (ref 70–100)
Potassium: 4.3 mEq/L (ref 3.5–5.3)
Sodium: 142 mEq/L (ref 135–145)

## 2023-05-03 LAB — CULTURE, METHICILLIN RESISTANT STAPHYLOCOCCUS AUREUS (MRSA)

## 2023-05-03 LAB — CREATINE KINASE (CK)
Creatine Kinase (CK): 4762 U/L — ABNORMAL HIGH (ref 47–294)
Creatine Kinase (CK): 4311 U/L — ABNORMAL HIGH (ref 47–294)

## 2023-05-03 MED ORDER — PHENOBARBITAL 32.4 MG PO TABS
32.4000 mg | ORAL_TABLET | Freq: Three times a day (TID) | ORAL | Status: AC
Start: 2023-05-04 — End: 2023-05-06
  Administered 2023-05-04 – 2023-05-06 (×6): 32.4 mg via ORAL
  Filled 2023-05-03 (×6): qty 1

## 2023-05-03 MED ORDER — ROCURONIUM BROMIDE 10 MG/ML IV SOLN (WRAP)
50.0000 mg | Freq: Once | INTRAVENOUS | Status: AC
Start: 2023-05-03 — End: 2023-05-03
  Administered 2023-05-03: 50 mg via INTRAVENOUS

## 2023-05-03 MED ORDER — THIAMINE HCL 100 MG/ML IJ SOLN
500.0000 mg | Freq: Three times a day (TID) | INTRAVENOUS | Status: AC
Start: 2023-05-03 — End: 2023-05-06
  Administered 2023-05-03 – 2023-05-06 (×9): 500 mg via INTRAVENOUS
  Filled 2023-05-03 (×9): qty 5

## 2023-05-03 MED ORDER — PHENOBARBITAL 32.4 MG PO TABS
64.8000 mg | ORAL_TABLET | Freq: Three times a day (TID) | ORAL | Status: DC
Start: 2023-05-05 — End: 2023-05-03

## 2023-05-03 MED ORDER — PHENOBARBITAL 32.4 MG PO TABS
97.2000 mg | ORAL_TABLET | Freq: Three times a day (TID) | ORAL | Status: DC
Start: 2023-05-03 — End: 2023-05-03

## 2023-05-03 MED ORDER — FENTANYL CITRATE (PF) 50 MCG/ML IJ SOLN (WRAP)
50.0000 ug | INTRAMUSCULAR | Status: DC | PRN
Start: 2023-05-03 — End: 2023-05-08
  Administered 2023-05-03 (×2): 50 ug via INTRAVENOUS
  Filled 2023-05-03 (×2): qty 1

## 2023-05-03 MED ORDER — ROCURONIUM BROMIDE 50 MG/5ML IV SOLN
INTRAVENOUS | Status: AC
Start: 2023-05-03 — End: 2023-05-03
  Administered 2023-05-03: 50 mg
  Filled 2023-05-03: qty 10

## 2023-05-03 MED ORDER — ENOXAPARIN SODIUM 30 MG/0.3ML IJ SOSY
30.0000 mg | PREFILLED_SYRINGE | Freq: Two times a day (BID) | INTRAMUSCULAR | Status: DC
Start: 2023-05-03 — End: 2023-05-16
  Administered 2023-05-03 – 2023-05-16 (×26): 30 mg via SUBCUTANEOUS
  Filled 2023-05-03 (×26): qty 0.3

## 2023-05-03 MED ORDER — ALBUTEROL-IPRATROPIUM 2.5-0.5 (3) MG/3ML IN SOLN
3.0000 mL | Freq: Three times a day (TID) | RESPIRATORY_TRACT | Status: DC
Start: 2023-05-04 — End: 2023-05-05
  Administered 2023-05-04 – 2023-05-05 (×6): 3 mL via RESPIRATORY_TRACT
  Filled 2023-05-03 (×5): qty 3

## 2023-05-03 MED ORDER — HYDROMORPHONE HCL 1 MG/ML IJ SOLN
0.4000 mg | INTRAMUSCULAR | Status: DC | PRN
Start: 2023-05-03 — End: 2023-05-13
  Administered 2023-05-03 – 2023-05-10 (×9): 0.4 mg via INTRAVENOUS
  Filled 2023-05-03 (×8): qty 1

## 2023-05-03 MED ORDER — METHOCARBAMOL 1000 MG/10ML IJ SOLN
1000.0000 mg | Freq: Three times a day (TID) | INTRAVENOUS | Status: DC
Start: 2023-05-03 — End: 2023-05-04
  Administered 2023-05-03 – 2023-05-04 (×3): 1000 mg via INTRAVENOUS
  Filled 2023-05-03 (×4): qty 10

## 2023-05-03 MED ORDER — ETOMIDATE 2 MG/ML IV SOLN
20.0000 mg | Freq: Once | INTRAVENOUS | Status: AC
Start: 2023-05-03 — End: 2023-05-03

## 2023-05-03 MED ORDER — IOHEXOL 350 MG/ML IV SOLN
120.0000 mL | Freq: Once | INTRAVENOUS | Status: AC | PRN
Start: 2023-05-03 — End: 2023-05-03
  Administered 2023-05-03: 120 mL via INTRAVENOUS

## 2023-05-03 MED ORDER — FOLIC ACID 1 MG PO TABS
1.0000 mg | ORAL_TABLET | Freq: Every day | ORAL | Status: AC
Start: 2023-05-03 — End: 2023-05-07
  Administered 2023-05-03 – 2023-05-07 (×5): 1 mg via ORAL
  Filled 2023-05-03 (×5): qty 1

## 2023-05-03 MED ORDER — SODIUM CHLORIDE 0.9 % IV BOLUS
1000.0000 mL | Freq: Once | INTRAVENOUS | Status: DC
Start: 2023-05-03 — End: 2023-05-03

## 2023-05-03 MED ORDER — THIAMINE (VITAMIN B1) 100 MG PO TABS (WRAP)
200.0000 mg | ORAL_TABLET | Freq: Every day | ORAL | Status: AC
Start: 2023-05-09 — End: 2023-05-09
  Administered 2023-05-09: 200 mg via ORAL
  Filled 2023-05-03: qty 2

## 2023-05-03 MED ORDER — PHENOBARBITAL 32.4 MG PO TABS
64.8000 mg | ORAL_TABLET | Freq: Three times a day (TID) | ORAL | Status: AC
Start: 2023-05-03 — End: 2023-05-04
  Administered 2023-05-03 – 2023-05-04 (×4): 64.8 mg via ORAL
  Filled 2023-05-03 (×4): qty 2

## 2023-05-03 MED ORDER — PHENOBARBITAL 32.4 MG PO TABS
32.4000 mg | ORAL_TABLET | Freq: Three times a day (TID) | ORAL | Status: DC
Start: 2023-05-07 — End: 2023-05-03

## 2023-05-03 MED ORDER — FENTANYL CITRATE-NACL 1-0.9 MG/100ML-% IV SOLN
0.0000 ug/h | INTRAVENOUS | Status: DC
Start: 2023-05-03 — End: 2023-05-08
  Administered 2023-05-03: 100 ug/h via INTRAVENOUS
  Administered 2023-05-03: 50 ug/h via INTRAVENOUS
  Administered 2023-05-04 (×2): 75 ug/h via INTRAVENOUS
  Administered 2023-05-05 – 2023-05-06 (×3): 50 ug/h via INTRAVENOUS
  Administered 2023-05-07: 75 ug/h via INTRAVENOUS
  Administered 2023-05-08: 50 ug/h via INTRAVENOUS
  Filled 2023-05-03 (×9): qty 100

## 2023-05-03 MED ORDER — THIAMINE HCL 100 MG/ML IJ SOLN
250.0000 mg | INTRAVENOUS | Status: AC
Start: 2023-05-06 — End: 2023-05-08
  Administered 2023-05-06 – 2023-05-08 (×3): 250 mg via INTRAVENOUS
  Filled 2023-05-03 (×3): qty 2.5

## 2023-05-03 MED ORDER — TAB-A-VITE/BETA CAROTENE PO TABS
1.0000 | ORAL_TABLET | Freq: Every day | ORAL | Status: AC
Start: 2023-05-03 — End: 2023-05-07
  Administered 2023-05-03 – 2023-05-07 (×5): 1 via ORAL
  Filled 2023-05-03 (×5): qty 1

## 2023-05-03 MED ORDER — ALBUTEROL-IPRATROPIUM 2.5-0.5 (3) MG/3ML IN SOLN
3.0000 mL | Freq: Four times a day (QID) | RESPIRATORY_TRACT | Status: DC
Start: 2023-05-03 — End: 2023-05-03
  Administered 2023-05-03: 3 mL via RESPIRATORY_TRACT
  Filled 2023-05-03: qty 3

## 2023-05-03 MED ORDER — ACETAMINOPHEN 10 MG/ML IV SOLN
1000.0000 mg | Freq: Three times a day (TID) | INTRAVENOUS | Status: AC
Start: 2023-05-03 — End: 2023-05-04
  Administered 2023-05-03 – 2023-05-04 (×3): 1000 mg via INTRAVENOUS
  Filled 2023-05-03 (×2): qty 100

## 2023-05-03 MED ORDER — FENTANYL CITRATE (PF) 50 MCG/ML IJ SOLN (WRAP)
INTRAMUSCULAR | Status: AC
Start: 2023-05-03 — End: 2023-05-04
  Filled 2023-05-03: qty 1

## 2023-05-03 MED ORDER — ACETYLCYSTEINE 10 % IN SOLN
3.0000 mL | Freq: Three times a day (TID) | RESPIRATORY_TRACT | Status: AC
Start: 2023-05-03 — End: 2023-05-05
  Administered 2023-05-03 – 2023-05-05 (×6): 3 mL via RESPIRATORY_TRACT
  Filled 2023-05-03 (×6): qty 4

## 2023-05-03 MED ORDER — ETOMIDATE 2 MG/ML IV SOLN
INTRAVENOUS | Status: AC
Start: 2023-05-03 — End: 2023-05-03
  Administered 2023-05-03: 20 mg via INTRAVENOUS
  Filled 2023-05-03: qty 20

## 2023-05-03 MED ORDER — FENTANYL BOLUS FROM BAG
50.0000 ug | INTRAVENOUS | Status: DC | PRN
Start: 2023-05-03 — End: 2023-05-08
  Administered 2023-05-03 – 2023-05-08 (×14): 50 ug via INTRAVENOUS

## 2023-05-03 MED ORDER — SODIUM CHLORIDE 0.9 % IV BOLUS
1000.0000 mL | Freq: Once | INTRAVENOUS | Status: AC
Start: 2023-05-03 — End: 2023-05-03
  Administered 2023-05-03: 1000 mL via INTRAVENOUS

## 2023-05-03 MED ORDER — KETAMINE 200 MG/100 ML CNR PREMIX
4.0000 ug/kg/min | INTRAVENOUS | Status: DC
Start: 2023-05-03 — End: 2023-05-05
  Administered 2023-05-03: 3 ug/kg/min via INTRAVENOUS
  Administered 2023-05-04 – 2023-05-05 (×4): 4 ug/kg/min via INTRAVENOUS
  Filled 2023-05-03 (×5): qty 100

## 2023-05-03 NOTE — Procedures (Signed)
Chest Tube Insertion    Date/Time: 05/03/2023 8:55 PM    Performed by: Clydell Hakim, MD  Authorized by: Carlyn Reichert, MD  Consent: Written consent obtained.  Risks and benefits: risks, benefits and alternatives were discussed  Patient understanding: patient states understanding of the procedure being performed  Patient consent: the patient's understanding of the procedure matches consent given  Procedure consent: procedure consent matches procedure scheduled  Relevant documents: relevant documents present and verified  Site marked: the operative site was marked  Imaging studies: imaging studies available  Required items: required blood products, implants, devices, and special equipment available  Patient identity confirmed: arm band  Time out: Immediately prior to procedure a "time out" was called to verify the correct patient, procedure, equipment, support staff and site/side marked as required.  Indications: hemothorax    Sedation:  Patient sedated: yes    Anesthesia: local infiltration    Anesthesia:  Local Anesthetic: lidocaine 1% without epinephrine  Preparation: skin prepped with ChloraPrep  Placement location: right lateral  Scalpel size: 11  Tube size ([place]): pigtail.  Ultrasound guidance: no  Tension pneumothorax heard: no  Tube connected to: suction  Drainage characteristics: bloody  Suture material: 2-0 silk  Dressing: 4x4 sterile gauze and petrolatum-impregnated gauze  Patient tolerance: patient tolerated the procedure well with no immediate complications  Comments: Attending:    I was present and directed the resident in this entire procedure.    Particia Lather, MD, FACS

## 2023-05-03 NOTE — Plan of Care (Signed)
Problem: Compromised Sensory Perception  Goal: Sensory Perception Interventions  Outcome: Progressing  Flowsheets (Taken 05/03/2023 2000)  Sensory Perception Interventions: Offload heels, Pad bony prominences, Reposition q 2hrs/turn Clock, Q2 hour skin assessment under devices if present     Problem: Compromised Moisture  Goal: Moisture level Interventions  Outcome: Progressing  Flowsheets (Taken 05/03/2023 2000)  Moisture level Interventions: Moisture wicking products, Moisture barrier cream     Problem: Moderate/High Fall Risk Score >5  Goal: Patient will remain free of falls  Outcome: Progressing  Flowsheets (Taken 05/03/2023 0800 by Tami Lin, Jovita, RN)  High (Greater than 13):   HIGH-Consider use of low bed   HIGH-Apply yellow "Fall Risk" arm band   HIGH-Bed alarm on at all times while patient in bed   MOD-Include family in multidisciplinary POC discussions   MOD-Perform dangle, stand, walk (DSW) prior to mobilization   MOD-Re-orient confused patients   MOD-Remain with patient during toileting   MOD-Use of chair-pad alarm when appropriate     Problem: Non-Violent Restraints Interdisciplinary Plan  Goal: Will be injury free during the use of non-violent restraints  Outcome: Progressing  Flowsheets (Taken 05/03/2023 1839 by Tami Lin, Jovita, RN)  Will be injury free during the use of non-violent restraints:   Attempt all alternatives before use of restraints   Notify family of initiation of restraints   Ensure safety devices are properly applied and maintained   Nurse to accompany patient off unit when on restraints   Remove restraints before the indicated maximum length of time when meets criteria for discontinuation   Document significant changes in patient condition   Ensure that order for restraints has not expired
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
## 2023-05-03 NOTE — Plan of Care (Signed)
Problem: Non-Violent Restraints Interdisciplinary Plan  Goal: Will be injury free during the use of non-violent restraints  Outcome: Progressing  Flowsheets (Taken 05/03/2023 [phone])  Will be injury free during the use of non-violent restraints:   Attempt all alternatives before use of restraints   Notify family of initiation of restraints   Nurse to accompany patient off unit when on restraints   Remove restraints before the indicated maximum length of time when meets criteria for discontinuation   Document significant changes in patient condition   Ensure that order for restraints has not expired

## 2023-05-03 NOTE — Plan of Care (Signed)
Problem: Non-Violent Restraints Interdisciplinary Plan  Goal: Will be injury free during the use of non-violent restraints  05/03/2023 1227 by Tami Lin, Alesia Banda, RN  Outcome: Completed  05/03/2023 0939 by Tami Lin, Tally Mattox, RN  Outcome: Progressing  Flowsheets (Taken 05/03/2023 [phone])  Will be injury free during the use of non-violent restraints:   Attempt all alternatives before use of restraints   Notify family of initiation of restraints   Nurse to accompany patient off unit when on restraints   Remove restraints before the indicated maximum length of time when meets criteria for discontinuation   Document significant changes in patient condition   Ensure that order for restraints has not expired

## 2023-05-03 NOTE — Plan of Care (Addendum)
Surgery Trauma:     Denies neck pain, no posterior cervical tenderness, full ROM of neck without pain. Imaging reviewed with CT scan with C2 odontoid fracture, per NSGY no collar needed. Collar was removed, c-spine is clear. Exam completed with bedside RN providing Spanish translation. Patient report jaw pain, but no tenderness to C spine palpitation.     Jannette Spanner, NP

## 2023-05-03 NOTE — Procedures (Signed)
Austin Reilly is a 52 y.o. male patient.  Principal Problem:    Critical polytrauma  Active Problems:    Closed fracture of multiple ribs with flail chest    Traumatic pneumothorax    Injury of renal vein, right, initial encounter    Laceration of right kidney    Bilateral pubic rami fractures, closed, initial encounter    Sternoclavicular separation, right, initial encounter    Traumatic subarachnoid hemorrhage    Radius/ulna fracture, right, closed, initial encounter    Medical History[1]  Blood pressure (!) 149/91, pulse (!) 110, temperature (!) 100.8 F (38.2 C), resp. rate (!) 35, height 1.575 m (5\' 2" ), weight 83.5 kg (184 lb 1.4 oz), SpO2 99%.    Intubation    Date/Time: 05/03/2023 4:20 PM    Performed by: Malachi Paradise, MD  Authorized by: Dani Gobble, MD    Consent:     Consent obtained:  Written  Pre-procedure details:     Patient status:  Awake    Pretreatment meds: etomidate.    Paralytics:  Rocuronium  Procedure details:     Preoxygenation:  Bag valve mask    CPR in progress: no      Intubation method:  Oral    Oral intubation technique:  Video-assisted    Laryngoscope blade:  Mac 3    Tube size (mm):  7.5    Tube type:  Cuffed    Number of attempts:  1    Tube visualized through cords: yes    Placement assessment:     ETT to teeth:  23    Tube secured with:  ETT holder    Breath sounds:  Equal    Placement verification: chest rise, CXR verification, direct visualization, equal breath sounds and ETCO2 detector      CXR findings:  ETT in proper place  Post-procedure details:     Patient tolerance of procedure:  Tolerated well, no immediate complications        Karoline Caldwell, MD  05/03/2023    I agree with the above note as edited. I was present and participated in the entire procedure.    Era Skeen, MD  Trauma and Acute Care Surgeon        [1] No past medical history on file.

## 2023-05-03 NOTE — Plan of Care (Signed)
Problem: Non-Violent Restraints Interdisciplinary Plan  Goal: Will be injury free during the use of non-violent restraints  Outcome: Progressing  Flowsheets (Taken 05/03/2023 1839)  Will be injury free during the use of non-violent restraints:   Attempt all alternatives before use of restraints   Notify family of initiation of restraints   Ensure safety devices are properly applied and maintained   Nurse to accompany patient off unit when on restraints   Remove restraints before the indicated maximum length of time when meets criteria for discontinuation   Document significant changes in patient condition   Ensure that order for restraints has not expired
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
## 2023-05-03 NOTE — Plan of Care (Signed)
Problem: Non-Violent Restraints Interdisciplinary Plan  Goal: Will be injury free during the use of non-violent restraints  Outcome: Progressing  Flowsheets (Taken 05/03/2023 0034)  Will be injury free during the use of non-violent restraints:   Attempt all alternatives before use of restraints   Initiate least restrictive type of restraint that is effective   Include patient/family/caregiver in decisions related to safety   Provide and maintain safe environment   Ensure safety devices are properly applied and maintained   Nurse to accompany patient off unit when on restraints   Document observed patient actions according to protocol   Remove restraints before the indicated maximum length of time when meets criteria for discontinuation   Document significant changes in patient condition   Ensure that order for restraints has not expired   Reassess need for continued restraints   Provide debriefing as soon as possible and appropriate
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
## 2023-05-03 NOTE — Plan of Care (Signed)
Imaging review note  NSGY called for stability scan which showed new small amount of blood in interpeduncular cistern and occipital horn. Patient examined and remains intubated. He opens his eyes to voice  and follows commands briskly with full strength in all 4 extremities. At this time no changes to the plan.     Please call with further questions, neuro decline, or new neuroimaging requiring our review.    Standley Brooking, MD  PGY-4 NSGY

## 2023-05-03 NOTE — Progress Notes (Signed)
I evaluated patient at bedside.  He remains intubated.  Right upper extremity compartments are soft.  No concern for compartment syndrome.  Splint intact.  Brisk capillary fill x 5.  Imaging was reviewed.  Would recommend nonop management of the pelvic fractures, weight-bear as tolerated bilateral lower extremities.  Not managed with a right superior sternoclavicular joint subluxation dislocation.  Plan for surgical intervention of the right both bone forearm fracture next week once physiologically stable.  Risks and benefits were discussed with niece yesterday at the bedside.    Domingo Cocking MD
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
## 2023-05-03 NOTE — Discharge Instructions (Signed)
[facility]  Millions of Americans are living with psychiatric conditions, including mental illness and addiction, which are having a negative effect on their lives. But there is hope, and [facility] is here to help.    We offer a broad range of programs and services designed to meet people where they are and help them move forward - to survive and, ultimately, thrive. [facility] offers comprehensive inpatient and outpatient behavioral healthcare at locations across [place]. Our approach enables people to receive the right level of help for them, at any point of need.    Our dedicated team of psychiatrists, psychologists, nurses, therapists, social workers and other behavioral health professionals focus on supporting the total well-being - mind and body - of each person we serve. Wherever you are, and whatever you are facing, the time is right to take the first step.    Mental Health Services  Adult Inpatient Psychiatric Units  Adolescent Inpatient Psychiatric Unit  Partial Hospitalization Program  Outpatient Therapy  Medication Management    Substance Use Disorder Services ([facility])  Adult Inpatient Detox  Partial Hospitalization Program  Intensive Outpatient Program  Outpatient Therapy  Medication Management    Contact Us  A potential client may first call our intake at [phone] to perform a phone screening and to answer any questions or get more information about our programs. At the time of the phone screening, an intake assessment is scheduled.    [email or web address]    Treatment Resources for Alcohol & Substance Use Disorders:    The following providers take most commercial insurances--please call and confirm that they currently take your specific insurance plan.   *Providers marked with an asterisk (*) take some forms of Medicaid.   +Providers  marked with a plus (+) take some forms of Medicare.    [place] Locations:  Alphabetical by city    *[facility]   Residential   [address]  [place], [place] [address]  [phone]    [facility]  Outpatient Services  [address]   [place], [place]  [phone]    [facility]  Residential, Outpatient, Suboxone  [address]  [place], [place]  [phone]    [facility]  Outpatient  [address]   [place], [place]   [phone]    [facility]   Outpatient, Suboxone   [address]  [place], [place]  [phone]    *[facility]  Residential, Day Treatment, Outpatient, Suboxone  [address]   [place], [place]  [phone]    *+[facility]   Detoxification, Inpatient, Outpatient   [address]   [place], [place]  [phone]      [facility]  Residential Long Term (more than 30 days), Halfway House  , [place]  [phone]    *+[facility]   Detoxification, Day Treatment, Intensive Outpatient  [address]   [place], [place]  [phone]    [facility]   Adult Residential, Residential Dual Diagnosis, Day Treatment, Outpatient, Suboxone  [address]   [place], [place]  [phone]    [facility]  Outpatient  [address]  [place], [place]  [phone]    [facility]    Outpatient, Suboxone          [address]   [place], [place]  [phone]    [facility]   Outpatient, Methadone, Suboxone  [address]   [place], [place]   [phone]                                          [facility]   Outpatient   [address]  [place], [place]  [phone]    [facility] [place]  Residential, Day Treatment, Outpatient  [address]   [place], [place]  [phone]    [facility]   Residential   [address]   [place], [place]  [phone]      [facility]  Outpatient Services  [address]  [place], [place] [address]  [phone]    *+[facility]  Detoxification, Outpatient  [address]  [place], [place]  [phone]    *[facility]  Residential, Detox, Suboxone  [address]  [place], [place] [address]  [phone]    *[facility]  Residential  [address]  [place], [place] [address]  [phone]    *[facility]  Outpatient   [address]  [place], [place] [address]  [phone]    *[facility] [place]  Residential, Outpatient  [address]  [place], [place]  [phone]    *[facility]  Outpatient, Suboxone, Halfway House  [address]   [place], [place]  [phone]    [facility]   Residential   [address]  [place], [place]   [phone]    [facility]  Outpatient  [address]   [place], [place]  [phone]    [facility]    Residential  [address]  [place], [place]  [phone]    [facility] Ab[facility]  [address],  [place], [place] [address]  [phone]    [facility]   Outpatient, Suboxone  [address]  [place], [place]   [phone]    [facility]  Outpatient, Suboxone   [address]  [place], [place]   [phone]      [facility]  Day Treatment, Outpatient, Suboxone  [address]  [place],  [address]  [phone]      *[facility]  Residential, Day Treatment, Outpatient, Suboxone  Various [place] locations  [phone]    [place] Locations:  Alphabetical by city    +[facility]  Detoxification, Inpatient, Outpatient  [address] [place] [address]  [phone]    +[facility]  Residential, Outpatient  [address]   [place] [address]  [phone]    [facility]   Residential for Men  [address]  [place], [place]  Also [place] location  [phone]    [facility]  Residential  [address]  [place], [place]  [phone]    [facility] [place]  Outpatient  [address]  [place], [place]   [phone]    [facility]  Residential   [address]   [place], [place]   [phone]    [facility]  Residential  [address]  [place], [place]  [phone]    [facility]  Residential, Halfway House   [address]  [place], [place]   [phone]    [facility]   Residential  [address]  [place], [place]  [phone]    Avenues   Residential  Various locations including   multiple in [place]  [phone]    [facility] Locations:    [facility]  Outpatient  [address], [place], [place] [address]  [phone]     [facility]   [address]  [place], [place]  [phone]      Additional Residential (28-day) Treatment Options:    [facility]  Residential, Dual-Diagnosis  Various Locations  [phone]    [facility]   Residential   [address] [place] [address]  [phone]    [facility]   Residential, Outpatient   [address] [address]  Several other locations  [phone]    [facility]  Residential, Dual-Diagnosis    Various locations  [phone]    [facility]   Residential, Outpatient   [address] [place] [address]  And Other Locations  [phone]    +[facility]  Residential  [address] [place] [address]  [phone]    [facility]   Residential, Dual-Diagnosis  [address] [place] [address]   [phone]    [facility]  Residential   [address]   [place], [place] [address]   [phone]    +[facility]  Residential  [address] [place] [address]  [phone]    +[facility]  Residential  [address] [place] [address]  [phone]    +[facility] [place]  Residential  [address] [place] [address]  [phone]    [facility] [facility]   Residential   [address] [place] [address]  [phone]

## 2023-05-03 NOTE — Progress Notes (Signed)
Time out pre-procedure paused performed with RN. Physician gave go ahead. All necessary equipment was available. Patient extubated to High flow nasal cannula.     05/03/23 1217   Extubation   Extubation reason Param/CPAP Protocol   Extubated to [facility]   Adverse Reactions None

## 2023-05-04 ENCOUNTER — Inpatient Hospital Stay: Payer: Medicaid Other

## 2023-05-04 ENCOUNTER — Encounter: Payer: Self-pay | Admitting: Nurse Practitioner

## 2023-05-04 LAB — LAB USE ONLY - URINALYSIS WITH REFLEX TO MICROSCOPIC EXAM IF INDICATED
Urine Bilirubin: NEGATIVE
Urine Glucose: NEGATIVE
Urine Ketones: NEGATIVE mg/dL
Urine Leukocyte Esterase: NEGATIVE
Urine Nitrite: NEGATIVE
Urine Specific Gravity: 1.032 (ref 1.001–1.035)
Urine Urobilinogen: NORMAL mg/dL (ref 0.2–2.0)
Urine pH: 6 (ref 5.0–8.0)

## 2023-05-04 LAB — CBC
Absolute nRBC: 0 10*3/uL (ref <=0.00)
Hematocrit: 29.4 % — ABNORMAL LOW (ref 37.6–49.6)
Hemoglobin: 9.6 g/dL — ABNORMAL LOW (ref 12.5–17.1)
MCH: 31.7 pg (ref 25.1–33.5)
MCHC: 32.7 g/dL (ref 31.5–35.8)
MCV: 97 fL — ABNORMAL HIGH (ref 78.0–96.0)
MPV: 11.5 fL (ref 8.9–12.5)
Platelet Count: 137 10*3/uL — ABNORMAL LOW (ref 142–346)
RBC: 3.03 10*6/uL — ABNORMAL LOW (ref 4.20–5.90)
RDW: 15 % (ref 11–15)
WBC: 13.01 10*3/uL — ABNORMAL HIGH (ref 3.10–9.50)
nRBC %: 0 /100 WBC (ref <=0.0)

## 2023-05-04 LAB — BASIC METABOLIC PANEL
Anion Gap: 7 (ref 5.0–15.0)
BUN: 11 mg/dL (ref 9–28)
CO2: 21 mEq/L (ref 17–29)
Calcium: 8.2 mg/dL — ABNORMAL LOW (ref 8.5–10.5)
Chloride: 108 mEq/L (ref 99–111)
Creatinine: 0.8 mg/dL (ref 0.5–1.5)
GFR: >60 mL/min/{1.73_m2} (ref >=60.0)
Glucose: 110 mg/dL — ABNORMAL HIGH (ref 70–100)
Potassium: 3.9 mEq/L (ref 3.5–5.3)
Sodium: 136 mEq/L (ref 135–145)

## 2023-05-04 LAB — PROCALCITONIN: Procalcitonin: 5.11 ng/ml — ABNORMAL HIGH (ref 0.00–0.10)

## 2023-05-04 LAB — LAB USE ONLY - URINE GRAY CULTURE HOLD TUBE

## 2023-05-04 LAB — MAGNESIUM: Magnesium: 1.7 mg/dL (ref 1.6–2.6)

## 2023-05-04 MED ORDER — MIDAZOLAM HCL 1 MG/ML IJ SOLN (WRAP)
INTRAMUSCULAR | Status: AC | PRN
Start: 2023-05-04 — End: 2023-05-04
  Administered 2023-05-04: 2 mg via INTRAVENOUS

## 2023-05-04 MED ORDER — FAMOTIDINE 10 MG/ML IV SOLN (WRAP)
20.0000 mg | Freq: Two times a day (BID) | INTRAVENOUS | Status: DC
Start: 2023-05-04 — End: 2023-05-08
  Administered 2023-05-05 – 2023-05-06 (×2): 20 mg via INTRAVENOUS
  Filled 2023-05-04 (×2): qty 2

## 2023-05-04 MED ORDER — ACETAMINOPHEN 325 MG PO TABS
650.0000 mg | ORAL_TABLET | Freq: Four times a day (QID) | ORAL | Status: DC
Start: 2023-05-04 — End: 2023-05-07
  Administered 2023-05-04 – 2023-05-07 (×12): 650 mg via ORAL
  Filled 2023-05-04 (×12): qty 2

## 2023-05-04 MED ORDER — MIDAZOLAM HCL 1 MG/ML IJ SOLN (WRAP)
10.0000 mg | Freq: Once | INTRAMUSCULAR | Status: DC
Start: 2023-05-04 — End: 2023-05-07
  Filled 2023-05-04: qty 10

## 2023-05-04 MED ORDER — SODIUM CHLORIDE 0.9 % IV MBP
4.5000 g | Freq: Three times a day (TID) | INTRAVENOUS | Status: DC
Start: 2023-05-04 — End: 2023-05-04

## 2023-05-04 MED ORDER — FENTANYL CITRATE (PF) 50 MCG/ML IJ SOLN (WRAP)
INTRAMUSCULAR | Status: AC | PRN
Start: 2023-05-04 — End: 2023-05-04
  Administered 2023-05-04: 50 ug via INTRAVENOUS

## 2023-05-04 MED ORDER — VECURONIUM BROMIDE 10 MG IV SOLR
10.0000 mg | Freq: Once | INTRAVENOUS | Status: AC
Start: 2023-05-04 — End: 2023-05-04
  Administered 2023-05-04: 5 mg via INTRAVENOUS
  Filled 2023-05-04: qty 10

## 2023-05-04 MED ORDER — FAMOTIDINE 20 MG PO TABS
20.0000 mg | ORAL_TABLET | Freq: Two times a day (BID) | ORAL | Status: DC
Start: 2023-05-04 — End: 2023-05-08
  Administered 2023-05-04 – 2023-05-08 (×6): 20 mg via ORAL
  Filled 2023-05-04 (×7): qty 1

## 2023-05-04 MED ORDER — METHOCARBAMOL 500 MG PO TABS
1000.0000 mg | ORAL_TABLET | Freq: Four times a day (QID) | ORAL | Status: DC
Start: 2023-05-04 — End: 2023-05-16
  Administered 2023-05-04 – 2023-05-16 (×47): 1000 mg via ORAL
  Filled 2023-05-04 (×48): qty 2

## 2023-05-04 MED ORDER — FAMOTIDINE 10 MG/ML IV SOLN (WRAP)
20.0000 mg | Freq: Two times a day (BID) | INTRAVENOUS | Status: DC
Start: 2023-05-04 — End: 2023-05-04
  Administered 2023-05-04: 20 mg via INTRAVENOUS
  Filled 2023-05-04: qty 2

## 2023-05-04 MED ORDER — FENTANYL CITRATE (PF) 50 MCG/ML IJ SOLN (WRAP)
INTRAMUSCULAR | Status: AC | PRN
Start: 2023-05-04 — End: 2023-05-04
  Administered 2023-05-04: 100 ug via INTRAVENOUS

## 2023-05-04 MED ORDER — SODIUM CHLORIDE 0.9 % IV MBP
4.5000 g | Freq: Four times a day (QID) | INTRAVENOUS | Status: DC
Start: 2023-05-04 — End: 2023-05-07
  Administered 2023-05-04 – 2023-05-07 (×12): 4.5 g via INTRAVENOUS
  Filled 2023-05-04 (×12): qty 20

## 2023-05-04 MED ORDER — FENTANYL CITRATE (PF) 50 MCG/ML IJ SOLN (WRAP)
25.0000 ug | INTRAMUSCULAR | Status: AC | PRN
Start: 2023-05-04 — End: 2023-05-05
  Filled 2023-05-04: qty 4

## 2023-05-04 MED ORDER — LEVETIRACETAM 500 MG PO TABS
500.0000 mg | ORAL_TABLET | Freq: Two times a day (BID) | ORAL | Status: AC
Start: 2023-05-04 — End: 2023-05-07
  Administered 2023-05-04 – 2023-05-07 (×6): 500 mg via ORAL
  Filled 2023-05-04 (×6): qty 1

## 2023-05-04 MED ORDER — ROPIVACAINE HCL 5 MG/ML IJ SOLN
Freq: Once | INTRAMUSCULAR | Status: AC | PRN
Start: 2023-05-04 — End: 2023-05-04
  Filled 2023-05-04: qty 30

## 2023-05-04 MED ORDER — ROPIVACAINE 0.2 % ON-Q 750 ML ADJ RATE SINGLE FLOW (OUTSOURCED)
Status: AC
Start: 2023-05-04 — End: 2023-05-07
  Administered 2023-05-04: 745 mL via PERCUTANEOUS
  Filled 2023-05-04: qty 745

## 2023-05-04 MED ORDER — LEVETIRACETAM IN NACL 500 MG/100ML IV SOLN
500.0000 mg | Freq: Two times a day (BID) | INTRAVENOUS | Status: AC
Start: 2023-05-04 — End: 2023-05-07
  Administered 2023-05-05: 500 mg via INTRAVENOUS
  Filled 2023-05-04: qty 100

## 2023-05-04 NOTE — Sedation Documentation (Signed)
Patient tolerated procedure well.

## 2023-05-04 NOTE — Sedation Documentation (Signed)
Vital signs stable. 
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
## 2023-05-04 NOTE — SLP Progress Note (Signed)
[facility]   Speech Therapy Cancellation Note      Patient:  Austin Reilly MRN#:  [id number]  Unit:  [facility] TOWER 3 Room/Bed:  F307/F307.01    05/04/2023  Time: 9:41 AM     SLP Cancellation: Order     SLP Order Cancellation Reason: Not clinically indicated at this time (comment required) - Rehab decision (Patient intubated)    Patient intubated, not appropriate for swallow evaluation. Please re-submit SLP orders when patient is clinically appropriate.     Eliezer Champagne, M.A. CCC-SLP  Lonell Face, M.S. CCC-SLP  05/04/2023

## 2023-05-04 NOTE — Anesthesia Procedure Notes (Signed)
Peripheral Nerve Block    Patient location during procedure: ICU  Reason for block: Acute pain management      Injection technique: Non MRI Safe Catheter  Block Region: Serratus Plane  Laterality: Right      Block at surgeon's request: Yes          Staffing  Anesthesiologist: Odette Fraction, MD  Performed: Anesthesiologist       Pre-procedure Checklist   Completed: patient identified, surgical consent, pre-op evaluation, timeout performed, risks and benefits discussed, anesthesia consent given and correct site        Peripheral Block  Patient monitoring: EKG, Pulse oximetry, NIBP and Circuit O2  Patient position: Supine  Local infiltration: Lidocaine 1%  Sterile technique: Chloraprep      Needle  Needle type: Tuohy   Needle gauge: 18 G  Needle length: 4 in  Catheter size: 18 G      Guidance: ultrasound; image taken and retained in medical record  Ultrasound Guided: LA spread visualized, Needle visualized, Relevant anatomy identified (nerve, vessels, muscle) and Image stored or printed          Assessment   Incremental injection: yes  Injection made incrementally with aspirations every 5 mL.  Injection Resistance: no  Paresthesia Pain: No    Blood Aspirated: No  no suspected intravascular injection  Patient tolerated procedure well: Yes  Block Outcome: No complications, Successful block and Pain improved    Additional Notes  Safety pause performed to identify the correct patient, laterality and procedure, skin draped and prepped in the usual sterile fashion. Skin anesthetized with 0.5% lidocaine, all relevant neurovascular anatomy identified under US guidance, needle visualized throughout, negative aspiration for heme, injection without resistance, and negative aspiration q10mL.
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
## 2023-05-04 NOTE — Plan of Care (Signed)
Problem: Non-Violent Restraints Interdisciplinary Plan  Goal: Will be injury free during the use of non-violent restraints  Outcome: Progressing  Flowsheets (Taken 05/04/2023 1038)  Will be injury free during the use of non-violent restraints:   Attempt all alternatives before use of restraints   Initiate least restrictive type of restraint that is effective   Notify family of initiation of restraints   Provide and maintain safe environment   Include patient/family/caregiver in decisions related to safety   Document observed patient actions according to protocol   Nurse to accompany patient off unit when on restraints   Ensure safety devices are properly applied and maintained   Remove restraints before the indicated maximum length of time when meets criteria for discontinuation   Provide debriefing as soon as possible and appropriate   Document significant changes in patient condition   Reassess need for continued restraints   Ensure that order for restraints has not expired     Problem: Moderate/High Fall Risk Score >5  Goal: Patient will remain free of falls  Outcome: Progressing  Flowsheets (Taken 05/04/2023 1038)  High (Greater than 13):   HIGH-Initiate use of floor mats as appropriate   HIGH-Apply yellow "Fall Risk" arm band   MOD-Request PT/OT consult order for patients with gait/mobility impairment   MOD-Perform dangle, stand, walk (DSW) prior to mobilization   MOD-Re-orient confused patients   MOD-Include family in multidisciplinary POC discussions   HIGH-Utilize chair pad alarm for patient while in the chair   HIGH-Bed alarm on at all times while patient in bed

## 2023-05-05 DIAGNOSIS — T07XXXA Unspecified multiple injuries, initial encounter: Secondary | ICD-10-CM

## 2023-05-05 DIAGNOSIS — J9601 Acute respiratory failure with hypoxia: Secondary | ICD-10-CM

## 2023-05-05 LAB — TYPE AND SCREEN
ABO Rh: O POS
Antibody Screen: NEGATIVE

## 2023-05-05 LAB — WHOLE BLOOD EMERGENCY RELEASE
Expiration Date: 202408142359
ISBT CODE: 5100
Product Status: TRANSFUSED

## 2023-05-05 LAB — CROSSMATCH PRBC, 1 UNIT
Expiration Date: 202408172359
ISBT CODE: 5100
Product Status: TRANSFUSED

## 2023-05-05 LAB — CBC
Absolute nRBC: 0 10*3/uL (ref <=0.00)
Hematocrit: 27.5 % — ABNORMAL LOW (ref 37.6–49.6)
Hemoglobin: 9.3 g/dL — ABNORMAL LOW (ref 12.5–17.1)
MCH: 32.2 pg (ref 25.1–33.5)
MCHC: 33.8 g/dL (ref 31.5–35.8)
MCV: 95.2 fL (ref 78.0–96.0)
MPV: 10.8 fL (ref 8.9–12.5)
Platelet Count: 159 10*3/uL (ref 142–346)
RBC: 2.89 10*6/uL — ABNORMAL LOW (ref 4.20–5.90)
RDW: 15 % (ref 11–15)
WBC: 13.52 10*3/uL — ABNORMAL HIGH (ref 3.10–9.50)
nRBC %: 0 /100 WBC (ref <=0.0)

## 2023-05-05 LAB — MAGNESIUM: Magnesium: 1.7 mg/dL (ref 1.6–2.6)

## 2023-05-05 LAB — BASIC METABOLIC PANEL
Anion Gap: 7 (ref 5.0–15.0)
BUN: 11 mg/dL (ref 9–28)
CO2: 21 mEq/L (ref 17–29)
Calcium: 8.3 mg/dL — ABNORMAL LOW (ref 8.5–10.5)
Chloride: 107 mEq/L (ref 99–111)
Creatinine: 0.6 mg/dL (ref 0.5–1.5)
GFR: >60 mL/min/{1.73_m2} (ref >=60.0)
Glucose: 103 mg/dL — ABNORMAL HIGH (ref 70–100)
Potassium: 3.6 mEq/L (ref 3.5–5.3)
Sodium: 135 mEq/L (ref 135–145)

## 2023-05-05 MED ORDER — ALBUTEROL-IPRATROPIUM 2.5-0.5 (3) MG/3ML IN SOLN
3.0000 mL | Freq: Two times a day (BID) | RESPIRATORY_TRACT | Status: DC
Start: 2023-05-06 — End: 2023-05-06
  Administered 2023-05-06: 3 mL via RESPIRATORY_TRACT
  Filled 2023-05-05: qty 3

## 2023-05-05 MED ORDER — ACETAMINOPHEN 325 MG PO TABS
650.0000 mg | ORAL_TABLET | Freq: Once | ORAL | Status: AC
Start: 2023-05-05 — End: 2023-05-05
  Administered 2023-05-05: 650 mg via ORAL
  Filled 2023-05-05: qty 2

## 2023-05-05 NOTE — Procedures (Signed)
Bronchoscopy    Date/Time: 05/05/2023 1:24 AM    Performed by: Clydell Hakim, MD  Authorized by: Larwance Sachs, MD  Attending Supervision? Supervised by an attending physician            Consent: Written consent obtained.  Consent given by: patient  Patient identity confirmed: arm band  Time out: Immediately prior to procedure a "time out" was called to verify the correct patient, procedure, equipment, support staff and site/side marked as required.    Indications:   Indications:  Therapeutic and diagnostic    Sedation:  Patient sedated: yes  Sedatives: midazolam (6)  Analgesia: fentanyl (150)    Anesthesia:   Paralytic used::  Vecuronium (5)  Airway Inspection:   Route: endotracheal tube    Mucosal appearance and integrity: normal    Trachea: thick secretions    Right main stem bronchi: thick secretions    Right segmental bronchi: thick secretions    Left main stem bronchi: thick secretions    Left segmental bronchi: Normal      Diagnostic and Therapeutic Procedure:   Bronchial lavage obtained: Yes    Appearance of secretions: thick and yellow    Bronchial lavage culture:  Sent for culture  BAL culture source: right main bronchus    Secretions cleared: Yes    Patient condition post procedure:  Stable    patient tolerated the procedure well with no immediate complications      Attestation:    I was present for and directed Dr Allena Katz for the entirety of this procedure.    Larwance Sachs, MD, FACS  Acute Care Surgery
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
## 2023-05-05 NOTE — Plan of Care (Signed)
Problem: Non-Violent Restraints Interdisciplinary Plan  Goal: Will be injury free during the use of non-violent restraints  Outcome: Progressing  Flowsheets (Taken 05/04/2023 1038)  Will be injury free during the use of non-violent restraints:   Attempt all alternatives before use of restraints   Initiate least restrictive type of restraint that is effective   Notify family of initiation of restraints   Provide and maintain safe environment   Include patient/family/caregiver in decisions related to safety   Document observed patient actions according to protocol   Nurse to accompany patient off unit when on restraints   Ensure safety devices are properly applied and maintained   Remove restraints before the indicated maximum length of time when meets criteria for discontinuation   Provide debriefing as soon as possible and appropriate   Document significant changes in patient condition   Reassess need for continued restraints   Ensure that order for restraints has not expired     Problem: Moderate/High Fall Risk Score >5  Goal: Patient will remain free of falls  Outcome: Progressing  Flowsheets (Taken 05/05/2023 0718)  High (Greater than 13):   HIGH-Initiate use of floor mats as appropriate   HIGH-Apply yellow "Fall Risk" arm band   HIGH-Bed alarm on at all times while patient in bed   MOD-Include family in multidisciplinary POC discussions   MOD-Perform dangle, stand, walk (DSW) prior to mobilization   MOD-Re-orient confused patients   HIGH-Utilize chair pad alarm for patient while in the chair   MOD-Request PT/OT consult order for patients with gait/mobility impairment

## 2023-05-05 NOTE — Plan of Care (Signed)
Problem: Compromised Sensory Perception  Goal: Sensory Perception Interventions  Outcome: Progressing     Problem: Compromised Moisture  Goal: Moisture level Interventions  Outcome: Progressing     Problem: Compromised Activity/Mobility  Goal: Activity/Mobility Interventions  Outcome: Progressing     Problem: Compromised Nutrition  Goal: Nutrition Interventions  Outcome: Progressing     Problem: Compromised Friction/Shear  Goal: Friction and Shear Interventions  Outcome: Progressing     Problem: Moderate/High Fall Risk Score >5  Goal: Patient will remain free of falls  Outcome: Progressing     Problem: Non-Violent Restraints Interdisciplinary Plan  Goal: Will be injury free during the use of non-violent restraints  Outcome: Progressing
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
## 2023-05-06 ENCOUNTER — Inpatient Hospital Stay: Payer: Medicaid Other

## 2023-05-06 LAB — CBC
Absolute nRBC: 0 10*3/uL (ref <=0.00)
Hematocrit: 28.2 % — ABNORMAL LOW (ref 37.6–49.6)
Hemoglobin: 9.6 g/dL — ABNORMAL LOW (ref 12.5–17.1)
MCH: 32.5 pg (ref 25.1–33.5)
MCHC: 34 g/dL (ref 31.5–35.8)
MCV: 95.6 fL (ref 78.0–96.0)
MPV: 10.3 fL (ref 8.9–12.5)
Platelet Count: 190 10*3/uL (ref 142–346)
RBC: 2.95 10*6/uL — ABNORMAL LOW (ref 4.20–5.90)
RDW: 15 % (ref 11–15)
WBC: 17.95 10*3/uL — ABNORMAL HIGH (ref 3.10–9.50)
nRBC %: 0 /100 WBC (ref <=0.0)

## 2023-05-06 LAB — MAGNESIUM: Magnesium: 1.6 mg/dL (ref 1.6–2.6)

## 2023-05-06 LAB — BASIC METABOLIC PANEL
Anion Gap: 6 (ref 5.0–15.0)
BUN: 13 mg/dL (ref 9–28)
CO2: 23 mEq/L (ref 17–29)
Calcium: 8.4 mg/dL — ABNORMAL LOW (ref 8.5–10.5)
Chloride: 104 mEq/L (ref 99–111)
Creatinine: 0.6 mg/dL (ref 0.5–1.5)
GFR: >60 mL/min/{1.73_m2} (ref >=60.0)
Glucose: 122 mg/dL — ABNORMAL HIGH (ref 70–100)
Potassium: 3.7 mEq/L (ref 3.5–5.3)
Sodium: 133 mEq/L — ABNORMAL LOW (ref 135–145)

## 2023-05-06 MED ORDER — ALBUTEROL-IPRATROPIUM 2.5-0.5 (3) MG/3ML IN SOLN
3.0000 mL | Freq: Three times a day (TID) | RESPIRATORY_TRACT | Status: DC
Start: 2023-05-06 — End: 2023-05-09
  Administered 2023-05-06 – 2023-05-08 (×7): 3 mL via RESPIRATORY_TRACT
  Filled 2023-05-06 (×7): qty 3

## 2023-05-06 MED ORDER — ACETYLCYSTEINE 10 % IN SOLN
3.0000 mL | Freq: Three times a day (TID) | RESPIRATORY_TRACT | Status: AC
Start: 2023-05-06 — End: 2023-05-07
  Administered 2023-05-06 – 2023-05-07 (×6): 3 mL via RESPIRATORY_TRACT
  Filled 2023-05-06 (×6): qty 4

## 2023-05-06 NOTE — Plan of Care (Signed)
Problem: Compromised Sensory Perception  Goal: Sensory Perception Interventions  Outcome: Progressing     Problem: Compromised Moisture  Goal: Moisture level Interventions  Outcome: Progressing     Problem: Compromised Activity/Mobility  Goal: Activity/Mobility Interventions  Outcome: Progressing     Problem: Compromised Nutrition  Goal: Nutrition Interventions  Outcome: Progressing     Problem: Compromised Friction/Shear  Goal: Friction and Shear Interventions  Outcome: Progressing     Problem: Moderate/High Fall Risk Score >5  Goal: Patient will remain free of falls  Outcome: Progressing     Problem: Non-Violent Restraints Interdisciplinary Plan  Goal: Will be injury free during the use of non-violent restraints  Outcome: Progressing

## 2023-05-06 NOTE — Plan of Care (Signed)
Problem: Compromised Activity/Mobility  Goal: Activity/Mobility Interventions  Outcome: Progressing  Flowsheets (Taken 05/06/2023 1241)  Activity/Mobility Interventions: Pad bony prominences, TAP Seated positioning system when OOB, Promote PMP, Reposition q 2 hrs / turn clock, Offload heels     Problem: Compromised Friction/Shear  Goal: Friction and Shear Interventions  Outcome: Progressing  Flowsheets (Taken 05/06/2023 1241)  Friction and Shear Interventions: Pad bony prominences, Off load heels, HOB 30 degrees or less unless contraindicated, Consider: TAP seated positioning, Heel foams     Problem: Moderate/High Fall Risk Score >5  Goal: Patient will remain free of falls  Outcome: Progressing  Flowsheets (Taken 05/06/2023 0800)  High (Greater than 13):   HIGH-Visual cue at entrance to patient's room   HIGH-Bed alarm on at all times while patient in bed   HIGH-Utilize chair pad alarm for patient while in the chair   HIGH-Apply yellow "Fall Risk" arm band   HIGH-Initiate use of floor mats as appropriate   HIGH-Consider use of low bed     Problem: Non-Violent Restraints Interdisciplinary Plan  Goal: Will be injury free during the use of non-violent restraints  Outcome: Progressing  Flowsheets (Taken 05/06/2023 1241)  Will be injury free during the use of non-violent restraints:   Attempt all alternatives before use of restraints   Initiate least restrictive type of restraint that is effective   Provide and maintain safe environment   Notify family of initiation of restraints   Include patient/family/caregiver in decisions related to safety   Ensure safety devices are properly applied and maintained   Document observed patient actions according to protocol   Nurse to accompany patient off unit when on restraints   Remove restraints before the indicated maximum length of time when meets criteria for discontinuation   Document significant changes in patient condition   Provide debriefing as soon as possible and  appropriate   Ensure that order for restraints has not expired   Reassess need for continued restraints

## 2023-05-06 NOTE — Discharge Summary (Signed)
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 directly with your provider about your health or refill a prescription anywhere/anytime.      Sign up using three easy steps:   Please go to [email or web address]           Click on Sign up now to access the new member sign up page  Enter your unique MyChart Access Code exactly as it appears below to complete the sign-up process.  If you experience issues with activating your account, the information you have provided does not completely match our records. Please contact your provider's office or any Hospital Registration Department to correct the information required.   MyChart Access Code: [email or web address]  Expires: 06/24/2023  3:23 PM      Having technical trouble with MyChart?   Call and select "4" to speak to a representative.  [phone])           ___________________________________________________________________      INSTRUCCIONES DE ALTA PARA LA CIRUGA DE ATENCIN AGUDA POR TRAUMA    Su equipo de administracin principal durante su estada fue el equipo de [place] de Cuidados Intensivos de Trauma (TACS, por sus siglas en ingls). Nuestra  oficina/direccin postal es:      Servicios de atencin de trauma agudo  [address]  [place], [place] [address]  Telfono: [phone] Fax: [phone]      Informacin de seguimiento:  No todos los pacientes necesitan seguimiento con nuestros cirujanos de trauma. Si se le recomienda hacerlo, llame al [phone] dentro de las 48 horas posteriores a su alta del hospital, centro de rehabilitacin aguda o centro de enfermera especializada. Nuestra clnica de trauma se lleva a cabo los martes.  Necesita hacer una cita para ser visto en la clnica de trauma?: S. Se recomienda que realice un seguimiento ambulatorio en la Clnica de Traumatologa por sus fracturas de costillas y neumotrax. Desafortunadamente, si no tiene seguro mdico, deber pagar un copago de $175.  Asista a su cita de seguimiento en la clnica de traumatologa programada para el martes 3 de septiembre de 2024 a las 2:30 p. m. (llegue antes de las 2:15 p. m.) con Neomia Dear radiografa de trax repetida realizada ANTES    IMAGENOLOGA:  Necesitar una radiografa repetida, temprano en la maana de su cita en la clnica de traumatologa. El pedido se realiz dentro del sistema informtico de [place].  Llame a [employer] para programar su imagenologa [phone].    Dieta: Recomendaciones:  Slidos: blandos y del tamao de un bocado (IDDSI SB6)  Lquidos: lquidos ligeros (IDDSI TN0) por [place] oral, cualquier modalidad  Medicamentos: por [place] oral, enteros, con lquido    Precauciones:  Precauciones/compensaciones: alerta y despierto, posicin erguida, marcapasos externo  Supervisin: supervisin individual    Actividad: soportar peso segn lo tolere; actividad segn lo tolere; Medical illustrator objetos pesados o actividades extenuantes; se recomienda caminar; se recomienda estar fuera de la cama/sentado erguido durante el da  - Caminar con frecuencia  - Contine usando el espirmetro de incentivo 10 veces por hora  mientras est despierto, su objetivo es 2000 ml  - Es importante toser y Industrial/product designer profundamente, esto ayuda a prevenir la neumona, que es el mayor factor de riesgo de las costillas rotas.  - Regrese a la sala de emergencias si tiene dificultad para respirar cada vez ms intensa, fiebre o empeoramiento del [employer]ck [employer] en el pecho    Actividades que debe y no debe hacer  La recuperacin generalmente demora entre 3 y 12 semanas. A menos que su  mdico le indique lo contrario, durante este tiempo siga las pautas  generales que se indican a continuacin.    NO realice las siguientes actividades:   No levante ms de 10 libras. En otras palabras, no levante nada que pese ms de un galn de [place].   No empuje ni tire de nada pesado. Por ejemplo, no pase la aspiradora, no corte el csped ni palee.   No realice actividades que puedan causar lesiones, como deportes de  contacto o ejercicios de alto impacto. Por ejemplo, evite:  - Ftbol, baloncesto o lucha libre  - Deportes que requieran casco  - Senderismo, ciclismo o correr  - Technical sales engineer a caballo o andar en vehculos todo terreno  - Aerbicos, abdominales o sentadillas    HAGA las siguientes actividades:   Camine, haga ejercicios de bajo impacto y reanude sus actividades diarias normales    Seales de advertencia: cundo buscar ayuda  Si tiene alguno de los sntomas a continuacin, especialmente si  estn empeorando, llame al [employer] de Insurance claims handler o vaya  al departamento de emergencias ms cercano:   Mareos o aturdimiento   Aumento del Engineer, mining en el abdomen   Hinchazn en el abdomen o sensacin de saciedad   Fiebre de 89 F o ms   Nuseas o vmitos que no mejoran   Dificultad para Producer, television/film/video los intestinos o estreimiento  que no mejora      Instrucciones especiales adicionales al momento del alta: Evite los [employer] ibuprofeno, Advil, Motrin, Naproxeno, Aleve y aspirina hasta que tenga una cita de seguimiento con la clnica de traumatologa debido a su lesin renal derecha. Est  bien tomar Tylenol de venta  libre.    - Durante su hospitalizacin, su nivel de sodio en sangre disminuy. Se recomienda que tenga una cita de seguimiento ambulatorio con un mdico de atencin primaria dentro de la semana posterior al alta para un control posterior a la hospitalizacin. Contine tomando sus tabletas de sal en su casa y limite su consumo de agua a menos de 1 Astronomer. Se recomiendan bebidas como Gatorade, Powerade y jugos en [address]    - Lo han derivado a [facility] para una cita posterior a la hospitalizacin con un proveedor de Marine scientist. Esta clnica se especializa en personas que no tienen seguro y no tienen un proveedor de atencin primaria:  Llame para programar su cita dentro de la semana posterior al alta.    Heridas/Incisiones Quirrgicas:  Se le darn instrucciones especficas sobre cmo cuidar su herida. En general, los apsitos se pueden quitar 48 horas despus de la operacin o el alta y puede ducharse. No remoje sus incisiones. Si la herida se enrojece, se calienta o comienza a drenar lquido, o si comienza a Scientist, research (physical sciences) o Teacher, music en el sitio, comunquese con nuestra oficina.    Formularios que requieren Technical brewer de un proveedor:  Si tiene formularios de Dispensing optician, formularios de International aid/development worker y Community education officer) o formularios de compensacin laboral que su proveedor debe Warden/ranger, enve los formularios por correo o fax a Animator oficina a la direccin postal que se indica arriba.    Analgsico:  NO conduzca ni opere maquinaria mientras toma analgsicos  Evite beber alcohol mientras toma analgsicos    El servicio de trauma puede manejar su dolor general postoperatorio o postraumtico. Si su dolor se debe a lesiones o a una operacin que fue manejada o realizada por otro equipo de [place] o Agricultural consultant, como ortopedia, neurociruga o ciruga de columna, le rogamos que llame al consultorio de ese cirujano para atender sus  necesidades de Engineer, mining. Si tiene un seguimiento con el servicio de Insurance claims handler, llame a la oficina de Insurance claims handler. Planifique en consecuencia, ya que las recargas pueden demorar entre 48 y 72 horas.    Es importante tomar sus medicamentos a tiempo y nunca tomar ms de la cantidad Chief of Staff.   Tmelo con comida para Acupuncturist   Los medicamentos tardan en hacer efecto, hasta 20-30 minutos para hacer efecto    Si el dolor disminuye, intente tomar su medicamento para el dolor con menos frecuencia.   Elimine una dosis de medicamento para Chief Technology Officer en un momento del da en que experimente menos dolor   Inicie el uso de Tylenol a medida que su dolor disminuya, hasta que ya no necesite tomar ningn medicamento para Chief Technology Officer     El estreimiento es un efecto secundario comn de los analgsicos   Debe estar tomando un ablandador de heces (p. ej., Colace) mientras est tomando cualquier analgsico narctico.   Coma muchas frutas y verduras y mantngase hidratado   Los laxantes y enemas de venta libre estn disponibles en su farmacia local      Si tiene preguntas sobre estas instrucciones, comunquese con Navarro, enfermera orientadora de trauma de [place], al [phone] o [email or web address], de lunes a viernes de 8 a.m. a 4 p.m.              [facility] - Neurociruga  [address]  [place], [place] [address]  Telfono: [phone] y Fax: [phone]    INSTRUCCIONES PARA EL  ALTA DE NEUROCIRUGA TRAS UNA LESIN DE LA COLUMNA VERTEBRAL NO OPERATORIA:    Neurocirujano: Dr. Sherril Croon    -Necesitar una cita de seguimiento en 6 semanas con Malachi Carl NP.    IMAGENOLOGA:  -Necesitar una radiografa repetida de la columna vertebral, que se realizar entre 1 y [address] antes de su cita con el neurocirujano.  -Llame a [facility] [phone] o a [facility] para programar su cita de imgenes [phone].    Instrucciones para fractura de espalda o  cuello sin ciruga    EXPECTATIVAS:  Llmenos si los sntomas empeoran repentinamente.    MEDICAMENTOS:  Evite tomar nicotina, esteroides, AINE/Aleve/ibuprofeno/Toradol/celebrex en las primeras 12 semanas despus de la fractura de columna. Tome Tylenol para Chief Technology Officer.    FUMAR:  Fumar retrasa el proceso de curacin; le solicitamos que evite fumar si es posible.    ACTIVIDAD:  No se agache ni levante objetos que pesen ms de 5 libras. Es muy importante levantarse y [employer] en casa, pero es igualmente importante no exagerar con las [place]. Planifique recibir [place] en [employer] [facility] [employer]. Podr caminar, tomar escaleras (con cuidado y lentamente), usar el bao y Actor de estar sentado a estar de pie usando los brazos de la silla como apoyo. Aumente lentamente su actividad hasta volver a la normalidad. Por ejemplo, aumente de una caminata corta de menos de 5 minutos los [address] a una vuelta a la [place] y [place] 15-20 minutos despus de una semana. No se doble, tuerza ni incline para levantar objetos o cambiar de posicin. Recuerde la buena mecnica de la espalda y use las piernas para sentarse y Actor.  Un menor movimiento del hueso fracturado permite una mayor probabilidad de curacin.  En [employer], se recomiendan actividades que [address] la columna bien [place] y minimicen el movimiento del hueso fracturado.    FISIOTERAPIA:  Hablaremos sobre la fisioterapia en su primera visita de seguimiento. No comenzar la terapia hasta despus de su primera cita de seguimiento.    ACTIVIDAD SEXUAL:  No se recomienda la actividad sexual hasta que haya realizado un seguimiento con nosotros en la clnica.    CONDUCCIN:  Si tuvo una fractura en la espalda, puede volver a conducir despus de 1 semana. En el caso de una fractura de cuello, debe esperar hasta que haya realizado un seguimiento en la clnica y ya no tenga el collarn cervical.    TRABAJO:  No regrese al Marisa Cyphers que el personal se  lo haya recomendado. Generalmente, recomendamos tomarse 4 semanas de descanso del trabajo para recuperarse, pero cada situacin individual se revisar de forma independiente.    TAREAS DEL HOGAR:  Evite pasar la aspiradora o lavar la ropa hasta que haya realizado el seguimiento en la clnica. El movimiento de agacharse y Lexicographer objetos puede ejercer presin sobre la espalda y el cuello.    PROBLEMAS O INQUIETUDES:  No dude en llamar a la oficina en cualquier momento si tiene problemas, inquietudes o preguntas. Siempre estamos disponibles para ayudarlo o responder una pregunta. En caso de emergencia, llame al 911 o vaya al Departamento de Emergencias ms cercano.    Nuestra oficina est abierta de lunes a viernes de 08:00 a 16:30 h. Si se comunica con la oficina despus de las 16:30 h, se le dirigir al servicio fuera de horario, que ser atendido por un mdico residente de neurociruga para pacientes internados o un asistente mdico.  [facility]  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 Emiliano Dyer. Deber ser examinado aproximadamente 2 semanas despus de recibir el alta del hospital. - Por favor, planee llegar al menos 15 minutos antes de la hora de su cita para tener tiempo para Scientist, product/process development y English as a second language teacher.  - Si es necesario, obtendremos radiografas en su primera cita de seguimiento ortopdico. No es necesario realizar radiografas antes de su cita a menos que tenga ciertos tipos de seguro o se le haya indicado que lo haga (si tiene mltiples  lesiones). Si tiene preguntas, comunquese con nuestra oficina para obtener orientacin.  - Si se someti a Chief Technology Officer, se examinar su incisin en su primera cita de seguimiento y, por lo general, se quitarn las grapas/suturas.  - Consulte con su seguro para ver si es necesaria una derivacin antes de su primera visita para evitar demoras.  - Su seguro est indicado como: Pagador: / . Comunquese con nuestra oficina si es necesario cambiar esto.    Estado de Sport and exercise psychologist de peso:  Tax adviser de peso segn lo tolere la extremidad superior derecha    Cuidado de la herida:  - Si nota un aumento de Administrator, arts, enrojecimiento de la incisin o mal olor de la incisin/herida y/o tiene Teacher, English as a foreign language, informe al consultorio de inmediato y/o dirjase directamente al Departamento de Emergencias para recibir atencin inmediata.  - Vendajes blandos (sin frula ni yeso): puede ducharse 3 das despus de la ciruga (sin baos ni incisin sumergida) a menos que se le indique lo contrario. No frote la incisin y squela con una toalla limpia. Puede cambiar los vendajes segn sea necesario con gasa y cinta adhesiva limpias o vendajes ACE, a menos que se le den instrucciones especficas.    Medicamentos:  - Para minimizar el uso de narcticos y Ship broker un mejor control del dolor, empleamos un enfoque multimodal del dolor. La dosis de los medicamentos variar de un paciente a otro en funcin de sus necesidades especficas, alergias e historial mdico, por lo que consulte la lista de medicamentos de alta. - El protocolo de control del dolor utiliza varios medicamentos, todos ellos enumerados a continuacin:    - Tylenol: Tylenol (acetaminofn) se puede adquirir sin receta. Es importante limitar la dosis a no ms de 3000 mg en un perodo de 24 horas. Se recomienda a los pacientes con problemas hepticos que se abstengan de tomarlo hasta que lo hayan discutido con su mdico de cabecera. La dosis habitual para adultos es de 1000 mg cada 8  horas durante las primeras 2-3 semanas y, luego, segn sea necesario. Para los nios, recomendamos una dosis basada en el peso segn las instrucciones del envase del medicamento. Antes de agregar Tylenol, asegrese de que otros analgsicos no contengan Tylenol.  - Gabapentina: tome gabapentina (neurontin) segn las instrucciones tres veces al da, ya que este medicamento funciona mejor cuando se toma segn lo programado, no segn lo necesario. No se debe suspender la gabapentina de [employer], ya que puede causar abstinencia. El uso de este medicamento ayudar a Human resources officer mediante un mecanismo  diferente al de los narcticos y tambin lo ayudar a dejar de tomarlos ms rpidamente. La mayora de los pacientes tomarn este medicamento durante 2 a 4 semanas. Para dejar de tomarlo, deje de tomar la dosis del medioda durante [address], luego deje de tomar la dosis de la maana durante 3 [address] y, por ltimo, deje de tomar la dosis de la noche.  - Narcticos: (Oxicodona/Dilaudid/Hidrocodona/Tramadol/etc.): Esta clase de medicamentos son medicamentos opioides recetados y solo deben tomarse segn las instrucciones. Tambin es importante seguir un rgimen intestinal constante (ver a continuacin) cuando se toman medicamentos narcticos, ya que el estreimiento es un efecto secundario comn. Si se prescribe un rango de dosis, recomiende tomar la dosis ms baja para el dolor moderado y la dosis ms alta para el dolor intenso. La [employer] de los pacientes tomarn estos medicamentos durante aproximadamente una semana, [employer], y los narcticos deben ser Financial risk analyst medicamento que deje de tomar. Es importante que NO conduzca un vehculo motorizado ni tome decisiones importantes mientras toma estos medicamentos. Por ltimo, evite consumir alcohol u otros medicamentos que sean sedantes o que puedan afectar su estado mental mientras toma narcticos. - Rgimen intestinal: tome ablandadores de heces (por  ejemplo, Colace) mientras est tomando analgsicos narcticos. Si no puede evacuar el intestino cada 2 o 3 das, tome laxantes de venta libre (Senokot -S/Miralax), enemas o supositorios para Educational psychologist.  - AINE: (Motrin, Advil, Ibuprofeno, Naprosyn, Aleve) - *Si est inscrito en un estudio sobre [place], siga las instrucciones del estudio anteriores segn est en el grupo de AINE S o en el grupo de AINE NO*. Los AINE se pueden adquirir sin Engineer, drilling. Hay fracturas ocasionales que son conocidas por su lenta curacin, en cuyo caso se le desaconsejar el uso de esta clase de medicamentos. Adultos: se recomienda tomar ibuprofeno 600 mg cada 8 horas durante las primeras 2-3 semanas (con alimentos) y luego puede dejar de tomarlo segn lo tolere. Suspenda el tratamiento si experimenta Programme researcher, broadcasting/film/video. Nios: se recomienda usar Motrin dosificado segn el peso del nio, segn las instrucciones del envase. Si tiene [employer], NO tome este medicamento hasta consultarlo con su mdico de cabecera. No puede tomar estos medicamentos si est embarazada.  ? Prevencin de lceras de estmago: el uso continuo de medicamentos AINE puede ser muy irritante para el estmago y puede provocar gastritis o lceras. Mientras toma AINE, le recomendamos que tambin tome Nexium (esomeprazol) 20 mg o Prevacid (lansoprazol) 15 mg por da, ambos medicamentos de [employer].  Prevencin de cogulos sanguneos:  - Es posible que Personnel officer recetado un anticoagulante como Lovenox (enoxaparina), heparina, aspirina o Xarelto (rivaroxabn) para reducir el riesgo de un cogulo sanguneo (trombosis venosa profunda; TVP). El tipo de medicamento y la duracin del tratamiento preventivo dependen del tipo de lesin y del procedimiento realizado.  - Por lo general, para las fracturas de pelvis y cadera, se prescribe Lovenox durante 3 semanas seguido de una aspirina infantil (81 mg) por [place] oral dos veces al da durante 3 semanas ms.  - Por lo  general, para fracturas de fmur (muslo), rodilla, parte inferior de la pierna, tobillo y pie, se prescribe aspirina infantil (81 mg) dos veces al da durante 3 semanas.  - A los pacientes con lesiones adicionales (cabeza, pecho, columna, abdomen), en silla de ruedas durante perodos prolongados, trastornos hemorrgicos y otros problemas mdicos se les pueden ajustar los medicamentos y las dosis.  - Si le han recetado Lovenox y no puede completarlo, le sugerimos  que tome 1 aspirina infantil (81 mg) por  oral dos veces al da (cada 12 horas).    Dieta:  - Reanude su dieta normal. Es comn tener menos apetito durante las primeras 1 o 2 semanas o despus de su lesin/ciruga. Esto puede deberse al Merck & Co y a los analgsicos que est tomando. Asegrese de beber mucho lquido durante este tiempo para mantenerse hidratado.    Radiografas (estudios de Insurance account manager):  - Si se realiza radiografas (o resonancias magnticas o tomografas computarizadas) en cualquier otro lugar que no sea nuestro consultorio, asegrese de Midwife las imgenes reales (en un CD) a la visita al [facility]. Un informe radiolgico no es suficiente; su proveedor debe visualizar las imgenes reales para determinar con precisin el plan de atencin.  - Antes de su primera visita a la clnica, llame a su seguro o a nuestro consultorio para asegurarse de que tiene cobertura de seguro para obtener radiografas en nuestro consultorio. Si no puede obtenerlas en nuestro consultorio en su visita a la clnica, lo ayudaremos a Community education officer de diagnstico por imgenes que acepte su plan de atencin mdica.    Fisioterapia/terapia ocupacional:  - Es posible que se le recete fisioterapia o terapia ocupacional para la rehabilitacin durante su recuperacin. Los centros de fisioterapia de [place] son parte del mismo sistema de atencin mdica [facility] y [employer]. Estos centros aceptan la Harley[employer]Davidson de los planes de seguro mdico,  pero le recomendamos que consulte con su plan de seguro para asegurarse de tener la [place].    Esperamos que haya experimentado una excelente atencin por parte de nuestro equipo en [facility]. Nos esforzamos continuamente por mejorar y ofrecer atencin de vanguardia y de primera clase a nuestros pacientes. Si desea expresar su gratitud o hacer una donacin para apoyar los esfuerzos de [place], [place] con nosotros al: [phone] o [email or web address].    Para obtener recursos de apoyo emocional despus de una lesin traumtica, comunquese con la coordinadora de la red de sobrevivientes de traumatismos de [place], al [phone] o [email or web address]    Follow Up:   Roena Malady, NP  [address]  [place] [address]  [phone]    Schedule an appointment as soon as possible for a visit in 6 week(s)  Please call to schedule an appointment with Neurosurgery within 6 weeks with repeat xrays completed 1-3 days prior.  1. Call to schedule Neurosurgery appointment [phone]). Ask clinic to order xrays  2. Call to schedule xrays [phone]) or [phone])      Llame para programar una cita con neurociruga dentro de las 6 semanas y realice radiografas repetidas entre 1 y 3 das antes.  1. Llame para programar una cita con neurociruga [phone]). Solicite a la Insurance claims handler radiografas  2. Llame para programar radiografas [phone]) o [phone])    [facility]  [address]  [address]  [place] [address]  [phone]  Go on 05/30/2023  Please attend your trauma clinic follow up appointment scheduled for Tuesday May 30, 2023 @ 2:30 PM (Arrive by 2:15 PM) with a repeat chest xray completed PRIOR      Asista a su cita de seguimiento en la clnica de traumatologa programada para el martes 3 de septiembre de 2024 a las 2:30 p. m. (llegue antes de  las 2:15 p. m.) con una radiografa de trax repetida completada ANTES.  Radiology- Outpatient  [facility]  [phone]  Schedule an appointment as soon as possible for a visit on 05/30/2023  Please call to schedule a repeat chest xray PRIOR to your trauma clinic appointment      Llame para programar una nueva radiografa de trax ANTES de su cita en la clnica de traumatologa.    Amado Nash, MD  [address]  [place] [address]  [phone]    Schedule an appointment as soon as possible for a visit in 2 week(s)  Please call to schedule a post-operation follow up appointment with Orthopedic Surgery      Por favor llame para programar una cita de seguimiento postoperatorio con [employer].    [facility]  [address]  [address]  [place] [address]  [phone]  Schedule an appointment as soon as possible for a visit in 1 week(s)  Please call to schedule a post-hospitalization appointment with primary care provider regarding your low sodium level      Llame para programar una cita posterior a la hospitalizacin con su proveedor de atencin primaria con respecto a su nivel bajo de sodio.    I personally saw and evaluated the patient with and without the attending, and drafted the above note.       I spent a total of 26 min, exclusive of the joint physician visit, and time spent on teaching, performing procedures and/or overlapping with any other providers, on:      - Summarizing the diagnoses, management and clinical course to the patient and/or family members  - Explaining in detail discharge and outpatient follow up instructions, including how to seek care should a complication arise after discharge to the patient and/or family members   - Preparing discharge medication prescriptions  - Answering any relevant questions   - Documentation time      Theresia Majors FNP-C  [id number]   Attending Attestation:   I saw and examined the patient on  the date of service with the team on multidisciplinary rounds. I reviewed the history, exam, laboratory findings, radiographic images, and plan. I agree with the above note as edited.       Era Skeen, MD  Trauma and Acute Care Surgeon
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
## 2023-05-06 NOTE — Plan of Care (Signed)
Problem: Compromised Nutrition  Goal: Nutrition Interventions  Outcome: Progressing  Flowsheets (Taken 05/06/2023 2000)  Nutrition Interventions: Discuss nutrition at Rounds, I&Os, Document % meal eaten, Daily weights     Problem: Non-Violent Restraints Interdisciplinary Plan  Goal: Will be injury free during the use of non-violent restraints  Outcome: Progressing  Flowsheets (Taken 05/06/2023 1241 by Jaquelyn Bitter, RN)  Will be injury free during the use of non-violent restraints:   Attempt all alternatives before use of restraints   Initiate least restrictive type of restraint that is effective   Provide and maintain safe environment   Notify family of initiation of restraints   Include patient/family/caregiver in decisions related to safety   Ensure safety devices are properly applied and maintained   Document observed patient actions according to protocol   Nurse to accompany patient off unit when on restraints   Remove restraints before the indicated maximum length of time when meets criteria for discontinuation   Document significant changes in patient condition   Provide debriefing as soon as possible and appropriate   Ensure that order for restraints has not expired   Reassess need for continued restraints

## 2023-05-06 NOTE — Respiratory Progress Note (Signed)
ETT pulled back 2cm to original good position at 23cm at the teeth.
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
## 2023-05-07 ENCOUNTER — Inpatient Hospital Stay: Payer: Medicaid Other

## 2023-05-07 LAB — CBC
Absolute nRBC: 0 10*3/uL (ref <=0.00)
Hematocrit: 28.4 % — ABNORMAL LOW (ref 37.6–49.6)
Hemoglobin: 9.6 g/dL — ABNORMAL LOW (ref 12.5–17.1)
MCH: 32.4 pg (ref 25.1–33.5)
MCHC: 33.8 g/dL (ref 31.5–35.8)
MCV: 95.9 fL (ref 78.0–96.0)
MPV: 10.4 fL (ref 8.9–12.5)
Platelet Count: 237 10*3/uL (ref 142–346)
RBC: 2.96 10*6/uL — ABNORMAL LOW (ref 4.20–5.90)
RDW: 15 % (ref 11–15)
WBC: 13.71 10*3/uL — ABNORMAL HIGH (ref 3.10–9.50)
nRBC %: 0 /100 WBC (ref <=0.0)

## 2023-05-07 LAB — BASIC METABOLIC PANEL
Anion Gap: 8 (ref 5.0–15.0)
BUN: 13 mg/dL (ref 9–28)
CO2: 24 mEq/L (ref 17–29)
Calcium: 7.8 mg/dL — ABNORMAL LOW (ref 8.5–10.5)
Chloride: 103 mEq/L (ref 99–111)
Creatinine: 0.5 mg/dL (ref 0.5–1.5)
GFR: >60 mL/min/{1.73_m2} (ref >=60.0)
Glucose: 106 mg/dL — ABNORMAL HIGH (ref 70–100)
Potassium: 3.9 mEq/L (ref 3.5–5.3)
Sodium: 135 mEq/L (ref 135–145)

## 2023-05-07 LAB — CULTURE, SPUTUM AND LOWER RESPIRATORY

## 2023-05-07 LAB — MAGNESIUM: Magnesium: 1.7 mg/dL (ref 1.6–2.6)

## 2023-05-07 MED ORDER — CALCIUM CARBONATE ANTACID 500 MG PO CHEW
1000.0000 mg | CHEWABLE_TABLET | Freq: Once | ORAL | Status: AC
Start: 2023-05-07 — End: 2023-05-07
  Administered 2023-05-07: 1000 mg via ORAL
  Filled 2023-05-07: qty 2

## 2023-05-07 MED ORDER — GABAPENTIN 300 MG PO CAPS
400.0000 mg | ORAL_CAPSULE | Freq: Three times a day (TID) | ORAL | Status: DC
Start: 2023-05-07 — End: 2023-05-08
  Administered 2023-05-07 – 2023-05-08 (×4): 400 mg via ORAL
  Filled 2023-05-07 (×6): qty 1

## 2023-05-07 MED ORDER — ROPIVACAINE 0.2 % ON-Q 750 ML ADJ RATE SINGLE FLOW (OUTSOURCED)
Status: DC
Start: 2023-05-07 — End: 2023-05-08
  Administered 2023-05-07: 745 mL via PERCUTANEOUS
  Filled 2023-05-07: qty 745

## 2023-05-07 MED ORDER — ACETAMINOPHEN 500 MG PO TABS
1000.0000 mg | ORAL_TABLET | Freq: Three times a day (TID) | ORAL | Status: DC
Start: 2023-05-07 — End: 2023-05-16
  Administered 2023-05-07 – 2023-05-16 (×26): 1000 mg via ORAL
  Filled 2023-05-07 (×27): qty 2

## 2023-05-07 MED ORDER — OXYCODONE HCL 10 MG PO TABS
10.0000 mg | ORAL_TABLET | ORAL | Status: DC | PRN
Start: 2023-05-07 — End: 2023-05-16
  Administered 2023-05-07 – 2023-05-15 (×17): 10 mg via ORAL
  Filled 2023-05-07: qty 2
  Filled 2023-05-07: qty 1
  Filled 2023-05-07: qty 2
  Filled 2023-05-07 (×2): qty 1
  Filled 2023-05-07: qty 2
  Filled 2023-05-07: qty 1
  Filled 2023-05-07: qty 2
  Filled 2023-05-07: qty 1
  Filled 2023-05-07 (×2): qty 2
  Filled 2023-05-07: qty 1
  Filled 2023-05-07 (×2): qty 2
  Filled 2023-05-07 (×2): qty 1
  Filled 2023-05-07 (×2): qty 2

## 2023-05-07 MED ORDER — STERILE WATER FOR INJECTION IJ/IV SOLN (WRAP)
2.0000 g | Freq: Three times a day (TID) | INTRAMUSCULAR | Status: AC
Start: 2023-05-07 — End: 2023-05-14
  Administered 2023-05-07 – 2023-05-14 (×20): 2 g via INTRAVENOUS
  Filled 2023-05-07 (×20): qty 2000

## 2023-05-07 MED ORDER — OXYCODONE HCL 5 MG PO TABS
5.0000 mg | ORAL_TABLET | ORAL | Status: DC | PRN
Start: 2023-05-07 — End: 2023-05-16
  Administered 2023-05-07 – 2023-05-16 (×3): 5 mg via ORAL
  Filled 2023-05-07 (×4): qty 1
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
## 2023-05-07 NOTE — Plan of Care (Signed)
Problem: Non-Violent Restraints Interdisciplinary Plan  Goal: Will be injury free during the use of non-violent restraints  Outcome: Progressing  Flowsheets (Taken 05/07/2023 2022)  Will be injury free during the use of non-violent restraints:   Attempt all alternatives before use of restraints   Initiate least restrictive type of restraint that is effective   Provide and maintain safe environment   Notify family of initiation of restraints   Include patient/family/caregiver in decisions related to safety   Document observed patient actions according to protocol   Ensure safety devices are properly applied and maintained   Nurse to accompany patient off unit when on restraints   Remove restraints before the indicated maximum length of time when meets criteria for discontinuation   Provide debriefing as soon as possible and appropriate   Document significant changes in patient condition   Reassess need for continued restraints   Ensure that order for restraints has not expired     Problem: Compromised Activity/Mobility  Goal: Activity/Mobility Interventions  Outcome: Progressing  Flowsheets (Taken 05/07/2023 2022)  Activity/Mobility Interventions: Pad bony prominences, TAP Seated positioning system when OOB, Promote PMP, Reposition q 2 hrs / turn clock, Offload heels     Problem: Compromised Moisture  Goal: Moisture level Interventions  Outcome: Progressing  Flowsheets (Taken 05/07/2023 2022)  Moisture level Interventions: Moisture wicking products, Moisture barrier cream

## 2023-05-07 NOTE — Plan of Care (Signed)
Problem: Compromised Sensory Perception  Goal: Sensory Perception Interventions  Outcome: Progressing     Problem: Compromised Moisture  Goal: Moisture level Interventions  Outcome: Progressing     Problem: Compromised Activity/Mobility  Goal: Activity/Mobility Interventions  Outcome: Progressing     Problem: Compromised Nutrition  Goal: Nutrition Interventions  Outcome: Progressing     Problem: Compromised Friction/Shear  Goal: Friction and Shear Interventions  Outcome: Progressing  Flowsheets (Taken 05/07/2023 0800)  Friction and Shear Interventions: Pad bony prominences, Off load heels, HOB 30 degrees or less unless contraindicated, Consider: TAP seated positioning, Heel foams     Problem: Moderate/High Fall Risk Score >5  Goal: Patient will remain free of falls  Outcome: Progressing  Flowsheets (Taken 05/07/2023 1046)  VH High Risk (Greater than 13):   Use of floor mat   Keep door open for better visibility     Problem: Non-Violent Restraints Interdisciplinary Plan  Goal: Will be injury free during the use of non-violent restraints  Outcome: Progressing  Flowsheets (Taken 05/06/2023 1241 by Jaquelyn Bitter, RN)  Will be injury free during the use of non-violent restraints:   Attempt all alternatives before use of restraints   Initiate least restrictive type of restraint that is effective   Provide and maintain safe environment   Notify family of initiation of restraints   Include patient/family/caregiver in decisions related to safety   Ensure safety devices are properly applied and maintained   Document observed patient actions according to protocol   Nurse to accompany patient off unit when on restraints   Remove restraints before the indicated maximum length of time when meets criteria for discontinuation   Document significant changes in patient condition   Provide debriefing as soon as possible and appropriate   Ensure that order for restraints has not expired   Reassess need for continued restraints
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
## 2023-05-08 ENCOUNTER — Inpatient Hospital Stay: Payer: Medicaid Other

## 2023-05-08 LAB — BASIC METABOLIC PANEL
Anion Gap: 6 (ref 5.0–15.0)
BUN: 14 mg/dL (ref 9–28)
CO2: 25 mEq/L (ref 17–29)
Calcium: 8.8 mg/dL (ref 8.5–10.5)
Chloride: 100 mEq/L (ref 99–111)
Creatinine: 0.6 mg/dL (ref 0.5–1.5)
GFR: >60 mL/min/{1.73_m2} (ref >=60.0)
Glucose: 121 mg/dL — ABNORMAL HIGH (ref 70–100)
Potassium: 3.8 mEq/L (ref 3.5–5.3)
Sodium: 131 mEq/L — ABNORMAL LOW (ref 135–145)

## 2023-05-08 LAB — CBC
Absolute nRBC: 0 10*3/uL (ref <=0.00)
Hematocrit: 27.9 % — ABNORMAL LOW (ref 37.6–49.6)
Hemoglobin: 9.5 g/dL — ABNORMAL LOW (ref 12.5–17.1)
MCH: 32 pg (ref 25.1–33.5)
MCHC: 34.1 g/dL (ref 31.5–35.8)
MCV: 93.9 fL (ref 78.0–96.0)
MPV: 10 fL (ref 8.9–12.5)
Platelet Count: 298 10*3/uL (ref 142–346)
RBC: 2.97 10*6/uL — ABNORMAL LOW (ref 4.20–5.90)
RDW: 15 % (ref 11–15)
WBC: 14.2 10*3/uL — ABNORMAL HIGH (ref 3.10–9.50)
nRBC %: 0 /100 WBC (ref <=0.0)

## 2023-05-08 LAB — CULTURE, SPUTUM AND LOWER RESPIRATORY

## 2023-05-08 MED ORDER — LIDOCAINE 5 % EX PTCH
2.0000 | MEDICATED_PATCH | CUTANEOUS | Status: DC
Start: 2023-05-08 — End: 2023-05-16
  Administered 2023-05-08 – 2023-05-16 (×6): 2 via TRANSDERMAL
  Filled 2023-05-08 (×8): qty 2

## 2023-05-08 MED ORDER — HYDRALAZINE HCL 20 MG/ML IJ SOLN
INTRAMUSCULAR | Status: AC
Start: 2023-05-08 — End: 2023-05-08
  Administered 2023-05-08: 5 mg via INTRAVENOUS
  Filled 2023-05-08: qty 1

## 2023-05-08 MED ORDER — ROPIVACAINE 0.2 % ON-Q 750 ML ADJ RATE SINGLE FLOW (OUTSOURCED)
Status: DC
Start: 2023-05-08 — End: 2023-05-10

## 2023-05-08 MED ORDER — LIDOCAINE HCL URETHRAL/MUCOSAL 2 % EX PRSY
PREFILLED_SYRINGE | Freq: Once | CUTANEOUS | Status: DC | PRN
Start: 2023-05-08 — End: 2023-05-08

## 2023-05-08 MED ORDER — HYDRALAZINE HCL 20 MG/ML IJ SOLN
5.0000 mg | Freq: Once | INTRAMUSCULAR | Status: AC
Start: 2023-05-08 — End: 2023-05-08

## 2023-05-08 MED ORDER — GABAPENTIN 300 MG PO CAPS
600.0000 mg | ORAL_CAPSULE | Freq: Three times a day (TID) | ORAL | Status: DC
Start: 2023-05-08 — End: 2023-05-10
  Administered 2023-05-08 – 2023-05-09 (×4): 600 mg via ORAL
  Filled 2023-05-08 (×4): qty 2

## 2023-05-08 NOTE — Plan of Care (Signed)
Problem: Non-Violent Restraints Interdisciplinary Plan  Goal: Will be injury free during the use of non-violent restraints  Outcome: Progressing  Flowsheets (Taken 05/07/2023 2022 by Haskel Khan, RN)  Will be injury free during the use of non-violent restraints:   Attempt all alternatives before use of restraints   Initiate least restrictive type of restraint that is effective   Provide and maintain safe environment   Notify family of initiation of restraints   Include patient/family/caregiver in decisions related to safety   Document observed patient actions according to protocol   Ensure safety devices are properly applied and maintained   Nurse to accompany patient off unit when on restraints   Remove restraints before the indicated maximum length of time when meets criteria for discontinuation   Provide debriefing as soon as possible and appropriate   Document significant changes in patient condition   Reassess need for continued restraints   Ensure that order for restraints has not expired

## 2023-05-08 NOTE — Plan of Care (Signed)
Problem: Non-Violent Restraints Interdisciplinary Plan  Goal: Will be injury free during the use of non-violent restraints  05/08/2023 0945 by Lolita Patella, RN  Outcome: Completed  05/08/2023 0749 by Lolita Patella, RN  Outcome: Progressing  Flowsheets (Taken 05/07/2023 2022 by Haskel Khan, RN)  Will be injury free during the use of non-violent restraints:   Attempt all alternatives before use of restraints   Initiate least restrictive type of restraint that is effective   Provide and maintain safe environment   Notify family of initiation of restraints   Include patient/family/caregiver in decisions related to safety   Document observed patient actions according to protocol   Ensure safety devices are properly applied and maintained   Nurse to accompany patient off unit when on restraints   Remove restraints before the indicated maximum length of time when meets criteria for discontinuation   Provide debriefing as soon as possible and appropriate   Document significant changes in patient condition   Reassess need for continued restraints   Ensure that order for restraints has not expired
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
## 2023-05-08 NOTE — SLP Progress Note (Signed)
Bedside swallow study completed. Formal report to follow. Recommend continued NPO/Corpak but allow occasional ice chips with RN for comfort and therapeutic benefit. SLP will follow closely. Thank you, Tylene Fantasia. Shatoya Roets MS, CCC-SLP
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
## 2023-05-08 NOTE — Progress Notes (Signed)
05/08/23 0945   Extubation   Extubation reason Param/CPAP Protocol   Extubated to [facility]   Adverse Reactions None

## 2023-05-08 NOTE — Plan of Care (Signed)
Problem: Pain interferes with ability to perform ADL  Goal: Pain at adequate level as identified by patient  Outcome: Progressing  Flowsheets (Taken 05/08/2023 2115)  Pain at adequate level as identified by patient:   Identify patient comfort function goal   Assess for risk of opioid induced respiratory depression, including snoring/sleep apnea. Alert healthcare team of risk factors identified.   Assess pain on admission, during daily assessment and/or before any "as needed" intervention(s)   Reassess pain within 30-60 minutes of any procedure/intervention, per Pain Assessment, Intervention, Reassessment (AIR) Cycle   Evaluate if patient comfort function goal is met   Evaluate patient's satisfaction with pain management progress   Offer non-pharmacological pain management interventions   Consult/collaborate with Pain Service   Consult/collaborate with Physical Therapy, Occupational Therapy, and/or Speech Therapy   Include patient/patient care companion in decisions related to pain management as needed     Problem: Side Effects from Pain Analgesia  Goal: Patient will experience minimal side effects of analgesic therapy  Outcome: Progressing  Flowsheets (Taken 05/08/2023 2115)  Patient will experience minimal side effects of analgesic therapy:   Monitor/assess patient's respiratory status (RR depth, effort, breath sounds)   Assess for changes in cognitive function   Prevent/manage side effects per LIP orders (i.e. nausea, vomiting, pruritus, constipation, urinary retention, etc.)   Evaluate for opioid-induced sedation with appropriate assessment tool (i.e. POSS)     Problem: Compromised Activity/Mobility  Goal: Activity/Mobility Interventions  Outcome: Progressing  Flowsheets (Taken 05/08/2023 2115)  Activity/Mobility Interventions: Pad bony prominences, TAP Seated positioning system when OOB, Promote PMP, Reposition q 2 hrs / turn clock, Offload heels

## 2023-05-09 ENCOUNTER — Inpatient Hospital Stay: Payer: Medicaid Other

## 2023-05-09 DIAGNOSIS — S270XXA Traumatic pneumothorax, initial encounter: Secondary | ICD-10-CM

## 2023-05-09 DIAGNOSIS — S37031A Laceration of right kidney, unspecified degree, initial encounter: Secondary | ICD-10-CM

## 2023-05-09 DIAGNOSIS — S3282XA Multiple fractures of pelvis without disruption of pelvic ring, initial encounter for closed fracture: Secondary | ICD-10-CM

## 2023-05-09 DIAGNOSIS — S066XAA Traumatic subarachnoid hemorrhage with loss of consciousness status unknown, initial encounter: Principal | ICD-10-CM

## 2023-05-09 DIAGNOSIS — S43204A Unspecified dislocation of right sternoclavicular joint, initial encounter: Secondary | ICD-10-CM

## 2023-05-09 LAB — MAGNESIUM: Magnesium: 1.6 mg/dL (ref 1.6–2.6)

## 2023-05-09 LAB — CBC
Absolute nRBC: 0 10*3/uL (ref <=0.00)
Hematocrit: 33.8 % — ABNORMAL LOW (ref 37.6–49.6)
Hemoglobin: 11.7 g/dL — ABNORMAL LOW (ref 12.5–17.1)
MCH: 31.9 pg (ref 25.1–33.5)
MCHC: 34.6 g/dL (ref 31.5–35.8)
MCV: 92.1 fL (ref 78.0–96.0)
MPV: 9.8 fL (ref 8.9–12.5)
Platelet Count: 451 10*3/uL — ABNORMAL HIGH (ref 142–346)
RBC: 3.67 10*6/uL — ABNORMAL LOW (ref 4.20–5.90)
RDW: 14 % (ref 11–15)
WBC: 15.94 10*3/uL — ABNORMAL HIGH (ref 3.10–9.50)
nRBC %: 0 /100 WBC (ref <=0.0)

## 2023-05-09 LAB — BASIC METABOLIC PANEL
Anion Gap: 7 (ref 5.0–15.0)
BUN: 14 mg/dL (ref 9–28)
CO2: 26 mEq/L (ref 17–29)
Calcium: 9.4 mg/dL (ref 8.5–10.5)
Chloride: 93 mEq/L — ABNORMAL LOW (ref 99–111)
Creatinine: 0.6 mg/dL (ref 0.5–1.5)
GFR: >60 mL/min/{1.73_m2} (ref >=60.0)
Glucose: 107 mg/dL — ABNORMAL HIGH (ref 70–100)
Potassium: 4.1 mEq/L (ref 3.5–5.3)
Sodium: 126 mEq/L — ABNORMAL LOW (ref 135–145)

## 2023-05-09 LAB — PHOSPHORUS: Phosphorus: 3.4 mg/dL (ref 2.3–4.7)

## 2023-05-09 LAB — TYPE AND SCREEN
ABO Rh: O POS
Antibody Screen: NEGATIVE

## 2023-05-09 LAB — SODIUM: Sodium: 125 mEq/L — ABNORMAL LOW (ref 135–145)

## 2023-05-09 LAB — CULTURE BLOOD AEROBIC AND ANAEROBIC
Culture Blood: NO GROWTH
Culture Blood: NO GROWTH

## 2023-05-09 MED ORDER — MELATONIN 3 MG PO TABS
6.0000 mg | ORAL_TABLET | Freq: Every evening | ORAL | Status: DC | PRN
Start: 2023-05-09 — End: 2023-05-16
  Administered 2023-05-09 – 2023-05-16 (×4): 6 mg via ORAL
  Filled 2023-05-09 (×4): qty 2

## 2023-05-09 MED ORDER — SODIUM CHLORIDE 1 G PO TABS
2.0000 g | ORAL_TABLET | Freq: Three times a day (TID) | ORAL | Status: DC
Start: 2023-05-09 — End: 2023-05-16
  Administered 2023-05-09 – 2023-05-16 (×18): 2 g via NASOGASTRIC
  Filled 2023-05-09 (×6): qty 2
  Filled 2023-05-09: qty 6
  Filled 2023-05-09 (×4): qty 2
  Filled 2023-05-09: qty 4
  Filled 2023-05-09: qty 1
  Filled 2023-05-09 (×2): qty 2

## 2023-05-09 MED ORDER — ALBUTEROL-IPRATROPIUM 2.5-0.5 (3) MG/3ML IN SOLN
3.0000 mL | Freq: Four times a day (QID) | RESPIRATORY_TRACT | Status: DC | PRN
Start: 2023-05-09 — End: 2023-05-16
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
## 2023-05-09 NOTE — Plan of Care (Signed)
Problem: Compromised Sensory Perception  Goal: Sensory Perception Interventions  Outcome: Progressing  Flowsheets (Taken 05/09/2023 1920)  Sensory Perception Interventions: Offload heels, Pad bony prominences, Reposition q 2hrs/turn Clock, Q2 hour skin assessment under devices if present     Problem: Compromised Moisture  Goal: Moisture level Interventions  Outcome: Progressing  Flowsheets (Taken 05/09/2023 1920)  Moisture level Interventions: Moisture wicking products, Moisture barrier cream     Problem: Compromised Activity/Mobility  Goal: Activity/Mobility Interventions  Outcome: Progressing  Flowsheets (Taken 05/09/2023 1920)  Activity/Mobility Interventions: Pad bony prominences, TAP Seated positioning system when OOB, Promote PMP, Reposition q 2 hrs / turn clock, Offload heels

## 2023-05-09 NOTE — Plan of Care (Signed)
Problem: Pain interferes with ability to perform ADL  Goal: Pain at adequate level as identified by patient  Flowsheets (Taken 05/08/2023 2115 by Haskel Khan, RN)  Pain at adequate level as identified by patient:   Identify patient comfort function goal   Assess for risk of opioid induced respiratory depression, including snoring/sleep apnea. Alert healthcare team of risk factors identified.   Assess pain on admission, during daily assessment and/or before any "as needed" intervention(s)   Reassess pain within 30-60 minutes of any procedure/intervention, per Pain Assessment, Intervention, Reassessment (AIR) Cycle   Evaluate if patient comfort function goal is met   Evaluate patient's satisfaction with pain management progress   Offer non-pharmacological pain management interventions   Consult/collaborate with Pain Service   Consult/collaborate with Physical Therapy, Occupational Therapy, and/or Speech Therapy   Include patient/patient care companion in decisions related to pain management as needed

## 2023-05-09 NOTE — SLP Progress Note (Signed)
[facility]   Speech Language Pathology  Treatment Note    Patient: Austin Reilly    MRN#: [id number]  Room: F329/F329.01    Treatment Type: Dysphagia     Recommendations/Plan:   Recommendations:  Solids:  NPO, continue, short-term alternate means of nutrition, allow unlimited ice chips under RN supervision  Liquids: sips and chips  Meds: Non-oral route     Precautions:   Precautions/Compensations: alert and awake, upright positioning, external pacing  Supervision: 1:1 supervision    Plan:   Referrals: none  SLP Frequency Recommended: 3-4x/wk  Discharge recommendations: Acute Rehab, Other (comment) (VFSS)    Assessment:   Pt presents with improvements compared to initial assessment with imporvements in vocal intensity and quality.  Voice remains hoarse but now audible with fleeting wet vocal quality.  Pt reports intermittent use of Y[place]auer given coughing up phlegm. Pt accepted trial of ice, water and puree with intermittent throat clear and wet vocal quality. Pt appears ready for instrumental swallow assessment VFSS vs FEES however plan at present is for return to OR tomorrow. Instrumental study not scheduled given tentative OR plans. SLP will monitor for readiness. In the interim continue NPO/Corpak but would allow unlimited ice chips for comfort and therapeutic benefit under RN supervision.    Subjective:   Patient is agreeable to participation in the therapy session. Nursing clears patient for therapy.. Patient is alert and agreeable to session. No Family present at bedside.   PAIN: yes, Pain rating: unable to rate pain and Location: neck, Intervention: RN notified    Behavior/cognition:  awake, alert, calm , pleasant, follows simple commands, and impulsive    Objective:   Patient Status:    - Current diet: NPO/short-term alternate means of nutrition   - Position: in bed   - Medical equipment in place: telemetry, IV, and corpak   - Respiratory Status:  2 L O2 via NC    - Interpreter services:  Yes, VRI, Interpreter Number: [id number]   - Precautions: fall and aspiration    - Food allergies: none    Dysphagia:   Oral Inspection:   Lips: moist/pink  Tongue: moist/pink  Saliva: WFL  Teeth: WFL  Oral care provided: yes  Comments: n/a    PO Trials Presented:   Ice chips  Thin (TN0) via spoon, via cup, and via straw   Puree (PU4)    Oral Phase:  WFL     Pharyngeal Phase/Airway Protection:  present hyolaryngeal movement   1-3 swallows per bolus  delayed throat clearing with thin via straw  wet vocal quality   Vitals:  WFL       Patient Education: verbal    Patient left with call bell within reach, all needs met, SCDs in place, fall mat in place, and bed alarm activated and all questions answered. RN notified of session outcome and patient response.    Goals:   Patient will participate in ongoing therapeutic diagnostic PO trials to assess candidacy for oral diet vs determine need/benefit for instrumental assessment x1 session. Continue  Pt will complete instrumental swallow assessment to further assess swallow physiology, aspiration risk , and determine safest least restrictive PO diet x1 session. NEW        [place].Shalyn Koral MS, CCC-SLP  Can be reached via Secure chat or pager ID [phone]  PPE Worn by Provider: gloves     Time of Treatment:  SLP Received On: 05/09/23  Start Time: 1620  Stop Time: 1645  Time Calculation (min):  25 min
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
## 2023-05-09 NOTE — Nursing Progress Note (Signed)
4 eyes in 4 hours pressure injury assessment note:      Completed with:   Unit & Time admitted:              Bony Prominences: Check appropriate box; if wound is present enter wound assessment in LDA     Occiput:                 [] WNL  []  Wound present  Face:                     [] WNL  []  Wound present  Ears:                      [] WNL  []  Wound present  Spine:                    [] WNL  []  Wound present  Shoulders:             [] WNL  []  Wound present  Elbows:                  [] WNL  []  Wound present  Sacrum/coccyx:     [] WNL  [x]  Wound present  Ischial Tuberosity:  [] WNL  []  Wound present  Trochanter/Hip:      [] WNL  []  Wound present  Knees:                   [] WNL  []  Wound present  Ankles:                   [] WNL  []  Wound present  Heels:                    [] WNL  [x]  Wound present  Other pressure areas:  []  Wound location       Device related: []  Device name:         LDA completed if wound present: yes/no  Consult WOCN if necessary    Other skin related issues, ie tears, rash, etc, document in Integumentary flowsheet
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
## 2023-05-09 NOTE — Progress Notes - Trauma (Signed)
TACS Nursing Progress Note    Austin Reilly is a 52 y.o. male  Admitted 05/01/2023  9:45 PM [facility] day 9) for Critical polytrauma [T07.XXXA]      Major Shift Events:  Pain managed with scheduled and PRN   Pt completing oral suction   Interpreter used   NPO 0000 OR 8/14  CHG x 2   Pt to OR     Review of Systems  Neuro:  AO4   FC  MAE - RUE weaker/splinted   Q4 neuro  X2 assist   Spanish speaking     Cardiac:  BP 137/84   Pulse 85   Temp 97.6 F (36.4 C) (Temporal)   Resp 16   Ht 1.575 m (5' 2.01")   Wt 81 kg (178 lb 9.2 oz)   SpO2 99%   BMI 32.65 kg/m       Respiratory:  2LNC - room air    GI/GU:  NPO - ice chips w/ RN   [place] @ 60  Continuous TF @ 40 - Pivot 1.5   Voids to urinal   LBM 8/13    BM this shift?     Patient admitted/transferred to this unit this shift? Yes   If yes, 4 eyes in 4 hours check complete? Yes   Findings and Locations: scattered abrasions and bruising, post head lac, R heel blister, L glute road rash  Documented in flow sheet?: yes   Second RN name: Kennedy Bucker       Psycho/Social:  Calm cooperative       Disposition for Discharge:tbd

## 2023-05-09 NOTE — Progress Notes (Signed)
Respiratory Therapy Patient Assessment    F307/F307.01  05/09/23 3:40 AM  RT: Su Grand, RT      Admitting DX: Critical polytrauma [T07.XXXA]    Pulmonary History: None    Other Pulm Hx:      Therapy ordered:       Respiratory Orders   (From admission, onward)                 Start     Ordered    05/08/23 1600  Pep Therapy  Every 8 hours scheduled (RT)      Comments:   All Adult patients ordered for Respiratory Therapy, i.e., inhaled meds, secretion clearance/lung expansion or Oxygen greater than 5 liters/min will be evaluated by a Respiratory Therapist and assessed per Respiratory Therapy Patient Driven Protocol.  Initial assessment and changes made per protocol can be found in the progress note section of the patient chart.    05/08/23 1348    05/08/23 1000  High Humidity High Flow Nasal Cannula  Continuous (RT)      Comments:   All Adult patients ordered for Respiratory Therapy, i.e., inhaled meds, secretion clearance/lung expansion or Oxygen greater than 5 liters/min will be evaluated by a Respiratory Therapist and assessed per Respiratory Therapy Patient Driven Protocol.  Initial assessment and changes made per protocol can be found in the progress note section of the patient chart.   Question:  Titrate to maintain SPO2  Answer:  90% or greater    05/08/23 1916    05/06/23 1600  Nebulizer treatment intermittent  Every 8 hours scheduled (RT)      Comments:   All Adult patients ordered for Respiratory Therapy, i.e., inhaled meds, secretion clearance/lung expansion or Oxygen greater than 5 liters/min will be evaluated by a Respiratory Therapist and assessed per Respiratory Therapy Patient Driven Protocol.  Initial assessment and changes made per protocol can be found in the progress note section of the patient chart.    05/06/23 1320    05/03/23 2000  Resp Re-Assess Adult (RT Use Only)  2 times daily (RT)       05/03/23 1555                   IP Meds - Nasal and Inhaled (From admission, onward)       Start     Stop Status Route Frequency Ordered    05/06/23 1600  albuterol-ipratropium (DUO-NEB) 2.5-0.5(3) mg/3 mL nebulizer 3 mL         -- Dispensed NEBULIZATION RT - Every 8 hours scheduled 05/06/23 1320             Subjective: good PT able to take deep breath? Yes Incentive Spirometry Goal (mL): 1500 mL  Incentive Spirometry Achieved (mL): 500 mL          Airway: Natural   Mobility: Non-ambulatory, needs assistance  CXR: Small bilateral pleural effusions and left basilar atelectasis with right  chest tube in place. No pneumothorax.    Cough Effort: Moderate Secretion Amount: Moderate    Sputum Consistency: Thick     Can clear secretions with cough? Yes  Can clear secretions with suctioning? Yes     Tobacco Use History[1]     Breath Sounds:  Bilateral Breath Sounds: Diminished  R Breath Sounds: Diminished  L Breath Sounds: Diminished    Heart Rate: 83 Resp Rate: 16  SpO2: 100 % O2 Device: HFNC  FiO2: 39 %  O2 Flow Rate (L/min): 45 L/min  Home regimen:  Home Treatments: n  Home Oxygen: n   Home CPAP/BiLevel: n    Criteria for therapy:  Secretion Clearance: Unable to clear sec with cough or suctioning  Lung Expansion: None indicated  Medications: None indicated    Recommendations/Interventions:  Recommendations/Interventions: Bronchodilators, IS (nursing), PEP     Expected Outcomes:  Secretion: Decreased secretions, Clears with cough or suction     Meds: Decreased thick secretions      Re-Evaluation:  Follow-up Date:   Improving with Therapy: Yes    Plan of Care Recommendations:  Plan of Care: Duoneb will be changed to PRN, PEP will continue Q8        [1]   Social History  Tobacco Use   Smoking Status Not on file   Smokeless Tobacco Not on file
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
## 2023-05-10 ENCOUNTER — Other Ambulatory Visit: Payer: Self-pay

## 2023-05-10 ENCOUNTER — Inpatient Hospital Stay: Payer: Medicaid Other

## 2023-05-10 ENCOUNTER — Encounter
Admission: AC | Disposition: A | Discharge status: Home / Self Care | Payer: Self-pay | Source: Home / Self Care | Attending: Surgical Critical Care

## 2023-05-10 ENCOUNTER — Encounter: Payer: Self-pay | Admitting: Surgery

## 2023-05-10 DIAGNOSIS — S52351A Displaced comminuted fracture of shaft of radius, right arm, initial encounter for closed fracture: Secondary | ICD-10-CM

## 2023-05-10 DIAGNOSIS — S52251A Displaced comminuted fracture of shaft of ulna, right arm, initial encounter for closed fracture: Secondary | ICD-10-CM

## 2023-05-10 HISTORY — PX: OPEN TREATMENT, INTERNAL FIXATION, RADIAL AND ULNAR SHAFT FRACTURES: SHX50233

## 2023-05-10 LAB — CBC
Absolute nRBC: 0 10*3/uL (ref <=0.00)
Absolute nRBC: 0 10*3/uL (ref <=0.00)
Hematocrit: 31.9 % — ABNORMAL LOW (ref 37.6–49.6)
Hematocrit: 32.2 % — ABNORMAL LOW (ref 37.6–49.6)
Hemoglobin: 11.1 g/dL — ABNORMAL LOW (ref 12.5–17.1)
Hemoglobin: 11.1 g/dL — ABNORMAL LOW (ref 12.5–17.1)
MCH: 31.8 pg (ref 25.1–33.5)
MCH: 31.4 pg (ref 25.1–33.5)
MCHC: 34.8 g/dL (ref 31.5–35.8)
MCHC: 34.5 g/dL (ref 31.5–35.8)
MCV: 91.4 fL (ref 78.0–96.0)
MCV: 91.2 fL (ref 78.0–96.0)
MPV: 9.5 fL (ref 8.9–12.5)
MPV: 9.3 fL (ref 8.9–12.5)
Platelet Count: 527 10*3/uL — ABNORMAL HIGH (ref 142–346)
Platelet Count: 577 10*3/uL — ABNORMAL HIGH (ref 142–346)
RBC: 3.49 10*6/uL — ABNORMAL LOW (ref 4.20–5.90)
RBC: 3.53 10*6/uL — ABNORMAL LOW (ref 4.20–5.90)
RDW: 14 % (ref 11–15)
RDW: 14 % (ref 11–15)
WBC: 15.06 10*3/uL — ABNORMAL HIGH (ref 3.10–9.50)
WBC: 18.03 10*3/uL — ABNORMAL HIGH (ref 3.10–9.50)
nRBC %: 0 /100 WBC (ref <=0.0)
nRBC %: 0 /100 WBC (ref <=0.0)

## 2023-05-10 LAB — BASIC METABOLIC PANEL
Anion Gap: 8 (ref 5.0–15.0)
Anion Gap: 12 (ref 5.0–15.0)
BUN: 14 mg/dL (ref 9–28)
BUN: 13 mg/dL (ref 9–28)
CO2: 26 mEq/L (ref 17–29)
CO2: 20 mEq/L (ref 17–29)
Calcium: 9.4 mg/dL (ref 8.5–10.5)
Calcium: 9.1 mg/dL (ref 8.5–10.5)
Chloride: 96 mEq/L — ABNORMAL LOW (ref 99–111)
Chloride: 96 mEq/L — ABNORMAL LOW (ref 99–111)
Creatinine: 0.6 mg/dL (ref 0.5–1.5)
Creatinine: 0.7 mg/dL (ref 0.5–1.5)
GFR: >60 mL/min/{1.73_m2} (ref >=60.0)
GFR: >60 mL/min/{1.73_m2} (ref >=60.0)
Glucose: 107 mg/dL — ABNORMAL HIGH (ref 70–100)
Glucose: 118 mg/dL — ABNORMAL HIGH (ref 70–100)
Potassium: 3.7 mEq/L (ref 3.5–5.3)
Potassium: 4 mEq/L (ref 3.5–5.3)
Sodium: 130 mEq/L — ABNORMAL LOW (ref 135–145)
Sodium: 128 mEq/L — ABNORMAL LOW (ref 135–145)

## 2023-05-10 LAB — PHOSPHORUS: Phosphorus: 3 mg/dL (ref 2.3–4.7)

## 2023-05-10 LAB — MAGNESIUM: Magnesium: 1.8 mg/dL (ref 1.6–2.6)

## 2023-05-10 SURGERY — OPEN TREATMENT, INTERNAL FIXATION, RADIAL AND ULNAR SHAFT FRACTURES
Anesthesia: Anesthesia General | Site: Leg Lower | Laterality: Right | Wound class: Clean

## 2023-05-10 MED ORDER — ROCURONIUM BROMIDE 50 MG/5ML IV SOLN
INTRAVENOUS | Status: AC
Start: 2023-05-10 — End: ?
  Filled 2023-05-10: qty 5

## 2023-05-10 MED ORDER — CEFAZOLIN SODIUM 2 G IJ SOLR
INTRAMUSCULAR | Status: AC
Start: 2023-05-10 — End: ?
  Filled 2023-05-10: qty 2000

## 2023-05-10 MED ORDER — OXYCODONE HCL 5 MG PO TABS
5.0000 mg | ORAL_TABLET | Freq: Once | ORAL | Status: DC | PRN
Start: 2023-05-10 — End: 2023-05-10

## 2023-05-10 MED ORDER — GABAPENTIN 300 MG PO CAPS
800.0000 mg | ORAL_CAPSULE | Freq: Three times a day (TID) | ORAL | Status: DC
Start: 2023-05-10 — End: 2023-05-16
  Administered 2023-05-10 – 2023-05-16 (×19): 800 mg via ORAL
  Filled 2023-05-10 (×5): qty 2
  Filled 2023-05-10: qty 1
  Filled 2023-05-10 (×12): qty 2
  Filled 2023-05-10: qty 1
  Filled 2023-05-10: qty 2

## 2023-05-10 MED ORDER — FENTANYL CITRATE (PF) 50 MCG/ML IJ SOLN (WRAP)
25.0000 ug | INTRAMUSCULAR | Status: AC | PRN
Start: 2023-05-10 — End: 2023-05-10
  Administered 2023-05-10 (×4): 25 ug via INTRAVENOUS

## 2023-05-10 MED ORDER — TOBRAMYCIN SULFATE 1.2 G IJ SOLR
INTRAMUSCULAR | Status: AC
Start: 2023-05-10 — End: ?
  Filled 2023-05-10: qty 2400

## 2023-05-10 MED ORDER — GLYCOPYRROLATE 0.2 MG/ML IJ SOLN (WRAP)
INTRAMUSCULAR | Status: AC
Start: 2023-05-10 — End: ?
  Filled 2023-05-10: qty 1

## 2023-05-10 MED ORDER — SODIUM CHLORIDE 0.9 % IV SOLN
Freq: Once | INTRAVENOUS | Status: DC | PRN
Start: 2023-05-10 — End: 2023-05-16
  Filled 2023-05-10: qty 50

## 2023-05-10 MED ORDER — PHENYLEPHRINE 100 MCG/ML IV SYRINGE FOR INFUSION (ANESTHESIA)
PREFILLED_SYRINGE | INTRAVENOUS | Status: DC | PRN
Start: 2023-05-10 — End: 2023-05-10
  Administered 2023-05-10: 30 ug/min via INTRAVENOUS

## 2023-05-10 MED ORDER — PROPOFOL 10 MG/ML IV EMUL (WRAP)
INTRAVENOUS | Status: AC
Start: 2023-05-10 — End: ?
  Filled 2023-05-10: qty 20

## 2023-05-10 MED ORDER — VANCOMYCIN HCL 1 G IV SOLR
INTRAVENOUS | Status: DC | PRN
Start: 2023-05-10 — End: 2023-05-10
  Administered 2023-05-10: 1 g via TOPICAL

## 2023-05-10 MED ORDER — SUGAMMADEX SODIUM 200 MG/2ML IV SOLN
INTRAVENOUS | Status: DC | PRN
Start: 2023-05-10 — End: 2023-05-10
  Administered 2023-05-10: 200 mg via INTRAVENOUS

## 2023-05-10 MED ORDER — ROPIVACAINE HCL 5 MG/ML IJ SOLN
INTRAMUSCULAR | Status: AC
Start: 2023-05-10 — End: ?
  Filled 2023-05-10: qty 30

## 2023-05-10 MED ORDER — STERILE WATER FOR INJECTION IJ/IV SOLN (WRAP)
2.0000 g | Freq: Three times a day (TID) | INTRAMUSCULAR | Status: DC
Start: 2023-05-10 — End: 2023-05-10

## 2023-05-10 MED ORDER — ONDANSETRON HCL 4 MG/2ML IJ SOLN
INTRAMUSCULAR | Status: DC | PRN
Start: 2023-05-10 — End: 2023-05-10
  Administered 2023-05-10: 4 mg via INTRAVENOUS

## 2023-05-10 MED ORDER — GLYCOPYRROLATE 0.2 MG/ML IJ SOLN (WRAP)
INTRAMUSCULAR | Status: DC | PRN
Start: 2023-05-10 — End: 2023-05-10
  Administered 2023-05-10: .1 mg via INTRAVENOUS

## 2023-05-10 MED ORDER — LACTATED RINGERS IV SOLN
INTRAVENOUS | Status: DC
Start: 2023-05-10 — End: 2023-05-10

## 2023-05-10 MED ORDER — SUCCINYLCHOLINE CHLORIDE 20 MG/ML IJ SOLN
INTRAMUSCULAR | Status: DC | PRN
Start: 2023-05-10 — End: 2023-05-10
  Administered 2023-05-10: 100 mg via INTRAVENOUS

## 2023-05-10 MED ORDER — PHENYLEPHRINE 100 MCG/ML IV BOLUS (ANESTHESIA)
PREFILLED_SYRINGE | INTRAVENOUS | Status: DC | PRN
Start: 2023-05-10 — End: 2023-05-10
  Administered 2023-05-10 (×2): 200 ug via INTRAVENOUS
  Administered 2023-05-10: 100 ug via INTRAVENOUS

## 2023-05-10 MED ORDER — BENZOCAINE 20% MT SOLN (WRAP)
1.0000 | Freq: Three times a day (TID) | OROMUCOSAL | Status: DC | PRN
Start: 2023-05-10 — End: 2023-05-16
  Administered 2023-05-10: 1 via OROMUCOSAL
  Filled 2023-05-10: qty 57

## 2023-05-10 MED ORDER — EPHEDRINE SULFATE 50 MG/ML IJ/IV SOLN (WRAP)
Status: AC
Start: 2023-05-10 — End: ?
  Filled 2023-05-10: qty 1

## 2023-05-10 MED ORDER — CEFAZOLIN SODIUM 1 G IJ SOLR
INTRAMUSCULAR | Status: DC | PRN
Start: 2023-05-10 — End: 2023-05-10
  Administered 2023-05-10: 2 g via INTRAVENOUS

## 2023-05-10 MED ORDER — MIDAZOLAM HCL 1 MG/ML IJ SOLN (WRAP)
INTRAMUSCULAR | Status: AC
Start: 2023-05-10 — End: ?
  Filled 2023-05-10: qty 2

## 2023-05-10 MED ORDER — LIDOCAINE HCL (PF) 2 % IJ SOLN
INTRAMUSCULAR | Status: AC
Start: 2023-05-10 — End: ?
  Filled 2023-05-10: qty 5

## 2023-05-10 MED ORDER — POLYETHYLENE GLYCOL 3350 17 G PO PACK
17.0000 g | PACK | Freq: Every day | ORAL | Status: DC
Start: 2023-05-10 — End: 2023-05-16
  Administered 2023-05-10 – 2023-05-16 (×5): 17 g via ORAL
  Filled 2023-05-10 (×5): qty 1

## 2023-05-10 MED ORDER — HYDROMORPHONE HCL 1 MG/ML IJ SOLN
0.5000 mg | Freq: Once | INTRAMUSCULAR | Status: AC
Start: 2023-05-10 — End: 2023-05-10
  Administered 2023-05-10: 0.5 mg via INTRAVENOUS

## 2023-05-10 MED ORDER — HYDROMORPHONE HCL 1 MG/ML IJ SOLN
0.5000 mg | INTRAMUSCULAR | Status: DC | PRN
Start: 2023-05-10 — End: 2023-05-10

## 2023-05-10 MED ORDER — FENTANYL CITRATE (PF) 50 MCG/ML IJ SOLN (WRAP)
INTRAMUSCULAR | Status: AC
Start: 2023-05-10 — End: ?
  Filled 2023-05-10: qty 2

## 2023-05-10 MED ORDER — DEXAMETHASONE SODIUM PHOSPHATE 4 MG/ML IJ SOLN
INTRAMUSCULAR | Status: DC | PRN
Start: 2023-05-10 — End: 2023-05-10
  Administered 2023-05-10: 4 mg via INTRAVENOUS

## 2023-05-10 MED ORDER — ROCURONIUM BROMIDE 10 MG/ML IV SOLN (WRAP)
INTRAVENOUS | Status: DC | PRN
Start: 2023-05-10 — End: 2023-05-10
  Administered 2023-05-10 (×3): 10 mg via INTRAVENOUS
  Administered 2023-05-10: 40 mg via INTRAVENOUS
  Administered 2023-05-10: 20 mg via INTRAVENOUS

## 2023-05-10 MED ORDER — ACETAMINOPHEN 500 MG PO TABS
1000.0000 mg | ORAL_TABLET | Freq: Once | ORAL | Status: DC | PRN
Start: 2023-05-10 — End: 2023-05-10

## 2023-05-10 MED ORDER — SODIUM CHLORIDE 0.9 % IR SOLN
Status: DC | PRN
Start: 2023-05-10 — End: 2023-05-10
  Administered 2023-05-10: 1000 mL

## 2023-05-10 MED ORDER — VANCOMYCIN HCL 1 G IV SOLR
INTRAVENOUS | Status: AC
Start: 2023-05-10 — End: ?
  Filled 2023-05-10: qty 2000

## 2023-05-10 MED ORDER — LACTATED RINGERS IV SOLN
INTRAVENOUS | Status: DC | PRN
Start: 2023-05-10 — End: 2023-05-10

## 2023-05-10 MED ORDER — ONDANSETRON HCL 4 MG/2ML IJ SOLN
4.0000 mg | Freq: Once | INTRAMUSCULAR | Status: DC | PRN
Start: 2023-05-10 — End: 2023-05-10

## 2023-05-10 MED ORDER — TOBRAMYCIN SULFATE 1.2 G IJ SOLR
INTRAMUSCULAR | Status: DC | PRN
Start: 2023-05-10 — End: 2023-05-10
  Administered 2023-05-10: 1200 mg

## 2023-05-10 MED ORDER — ALBUMIN HUMAN/BIOSIMILIAR 5% IV SOLN (WRAP)
INTRAVENOUS | Status: DC | PRN
Start: 2023-05-10 — End: 2023-05-10

## 2023-05-10 MED ORDER — BENZOCAINE-MENTHOL MT LOZG (WRAP)
1.0000 | LOZENGE | Freq: Once | OROMUCOSAL | Status: DC | PRN
Start: 2023-05-10 — End: 2023-05-10

## 2023-05-10 MED ORDER — EPHEDRINE SULFATE 50 MG/ML IJ/IV SOLN (WRAP)
Status: DC | PRN
Start: 2023-05-10 — End: 2023-05-10
  Administered 2023-05-10: 10 mg via INTRAVENOUS

## 2023-05-10 MED ORDER — LIDOCAINE HCL (PF) 1 % IJ SOLN
INTRAMUSCULAR | Status: AC
Start: 2023-05-10 — End: ?
  Filled 2023-05-10: qty 5

## 2023-05-10 MED ORDER — PROPOFOL 10 MG/ML IV EMUL (WRAP)
INTRAVENOUS | Status: DC | PRN
Start: 2023-05-10 — End: 2023-05-10
  Administered 2023-05-10: 100 mg via INTRAVENOUS

## 2023-05-10 MED ORDER — FENTANYL CITRATE (PF) 50 MCG/ML IJ SOLN (WRAP)
INTRAMUSCULAR | Status: DC | PRN
Start: 2023-05-10 — End: 2023-05-10
  Administered 2023-05-10 (×2): 50 ug via INTRAVENOUS

## 2023-05-10 MED ORDER — GABAPENTIN 300 MG PO CAPS
800.0000 mg | ORAL_CAPSULE | Freq: Three times a day (TID) | ORAL | Status: DC
Start: 2023-05-10 — End: 2023-05-10

## 2023-05-10 SURGICAL SUPPLY — 59 items
BAG EQUIPMENT L28 IN X W36 IN BAND (Other) ×1 IMPLANT
BANDAGE GAUZE L3.6 YD X W3.4 IN 6 PLY ABSORBENT STRETCH TIGHT FINISH (Bandage) ×1 IMPLANT
BANDAGE MEDLINE GAUZE L3.6 YD X W3.4 IN (Bandage) ×1
BANDAGE PROCARE COMP L5 YD X W4 IN 2 CLIP FASTENER SLF CLSR PLSTR CTTN (Procedure Accessories) ×1 IMPLANT
BANDAGE PROCARE COMPRESSION L5 YD X W4 (Procedure Accessories) ×1
BIT DRILL L110 MM OD2.5 MM QUICK COUPLING GOLD (Drillbits) ×1 IMPLANT
BIT DRILL L110 MM OD2.5 MM SYNTHES QUICK (Drillbits) ×1
BIT DRILL L125 MM OD2 MM QUICK COUPLING (Drillbits) ×1 IMPLANT
BIT DRILL L125 MM OD2 MM SYNTHES QUICK (Drillbits) ×1
BIT DRILL L85 MM OD1.5 MM QUICK COUPLING (Drillbits) ×1 IMPLANT
BIT DRILL L85 MM OD1.5 MM SYNTHES QUICK (Drillbits) ×1
COVER FLEXIBLE LIGHT HANDLE PLASTIC GREEN (Procedure Accessories) ×3 IMPLANT
COVER FLEXIBLE MEDLINE LIGHT HANDLE (Procedure Accessories) ×3
CUFF TOURNIQUET CYLINDRICAL L18 IN X W4 IN 2 PORT BLADDER QUICK (Procedure Accessories) ×1 IMPLANT
CUFF TRNQT CYL CLR CUF 18X4IN LF STRL 2 (Procedure Accessories) ×1
DRAPE SURGICAL IMPERVIOUS LARGE FILM L84 (Drape) ×1
DRAPE SURGICAL IMPERVIOUS LARGE FILM L84 IN X W60 IN SMS U (Drape) ×1 IMPLANT
DRAPE SURGICAL SHEET FANFOLD L98 IN X (Drape) ×1
DRAPE SURGICAL SHEET FANFOLD L98 IN X W77 IN TIBURON XL PINK (Drape) ×1 IMPLANT
DRESSING CURAD GAUZE MESH 9X5IN (Procedure Accessories) ×1
DRESSING PETROLATUM L9 IN X W5 IN NON ADHERENT OCCLUSIVE CURAD BISMUTH (Procedure Accessories) ×1 IMPLANT
ELECTRODE ADULT PATIENT RETURN L9 FT REM POLYHESIVE ACRYLIC FOAM (Procedure Accessories) ×1 IMPLANT
ELECTRODE PATIENT RETURN L9 FT VALLEYLAB (Procedure Accessories) ×1
GLOVE SURGICAL 8 1/2 BIOGEL PI INDICATOR (Glove) ×1
GLOVE SURGICAL 8 1/2 BIOGEL PI INDICATOR UNDERGLOVE POWDER FREE SMOOTH (Glove) ×1 IMPLANT
GLOVE SURGICAL 8 BIOGEL PI ORTHOPRO (Glove) ×2
GLOVE SURGICAL 8 BIOGEL PI ORTHOPRO POWDER FREE MICRO ROUGH TEXTURE (Glove) ×2 IMPLANT
PADDING CAST L4 YD X W4 IN UNDERCAST (Cast) ×1
PADDING CAST L4 YD X W4 IN UNDERCAST MILD STRETCH COHESIVE REGULAR (Cast) ×1 IMPLANT
PLATE L111 MM X W11 MM X H3.4 MM 8 HOLE (Plate) ×1 IMPLANT
PLATE L111 MM X W11 MM X H3.4 MM 8 HOLE LIMIT CONTACT TAPER END (Plate) ×1 IMPLANT
PLATE L137 MM X W11 MM X H3.4 MM 10 HOLE (Plate) ×1 IMPLANT
PLATE L137 MM X W11 MM X H3.4 MM 10 HOLE LIMIT CONTACT TAPER END (Plate) ×1 IMPLANT
SCREW BONE L16 MM OD3.5 MM 2.5 MM FULL THREAD STAINLESS STEEL CORTEX (Screw) ×10 IMPLANT
SCREW L12 MM OD2 MM ODSEC3.5 MM (Screw) ×2 IMPLANT
SCREW L12 MM OD2 MM ODSEC3.5 MM STAINLESS STEEL CORTEX SELF TAP (Screw) ×2 IMPLANT
SCREW L16 MM OD2 MM ODSEC3.5 MM (Screw) ×2 IMPLANT
SCREW L16 MM OD2 MM ODSEC3.5 MM STAINLESS STEEL CORTEX SELF TAP (Screw) ×2 IMPLANT
SCREW L16 MM OD3.5 MM 2.5 MM FULL THREAD (Screw) ×10 IMPLANT
SCREW L18 MM OD2 MM ODSEC3.5 MM (Screw) ×1 IMPLANT
SCREW L18 MM OD2 MM ODSEC3.5 MM STAINLESS STEEL CORTEX SELF TAP (Screw) ×1 IMPLANT
SCREW L18 MM OD3.5 MM ODSEC6 MM (Screw) ×2 IMPLANT
SCREW L18 MM OD3.5 MM ODSEC6 MM STAINLESS STEEL CORTEX SELF TAP LOW (Screw) ×2 IMPLANT
SCREW L20 MM OD3.5 MM ODSEC6 MM (Screw) ×1 IMPLANT
SCREW L20 MM OD3.5 MM ODSEC6 MM STAINLESS STEEL CORTEX SELF TAP LOW (Screw) ×1 IMPLANT
SOLUTION ANTISEPTIC RUB BOTTLE MEDLINE (Scrub Supplies) ×1 IMPLANT
SOLUTION SURGICAL PREP 26 ML DURAPREP (Prep) ×3
SOLUTION SURGICAL PREP 26 ML DURAPREP 74% ISOPROPYL ALCOHOL 0.7% (Prep) ×3 IMPLANT
SPONGE GAUZE L4 IN X W4 IN 12 PLY WOVEN (Dressing) ×2
SPONGE GAUZE L4 IN X W4 IN 12 PLY WOVEN FOLD EDGE XRAY DETECT COTTON (Dressing) ×2 IMPLANT
STAPLER SKIN L4.1 MM X W6.5 MM 35 WIDE (Staplers)
STAPLER SKIN L4.1 MM X W6.5 MM 35 WIDE STAPLE CARTRIDGE APPOSE ULC (Staplers) IMPLANT
SUTURE MONOCRYL 0 CT-1 L36 IN (Suture)
SUTURE MONOCRYL 0 CT-1 L36 IN MONOFILAMENT VIOLET ABSORBABLE (Suture) IMPLANT
SUTURE MONOCRYL 2-0 CT-1 L36 IN (Suture)
SUTURE MONOCRYL 2-0 CT-1 L36 IN MONOFILAMENT UNDYED ABSORBABLE (Suture) IMPLANT
TOWEL L27 IN X W17 IN COTTON PREWASH (Other) ×1
TOWEL L27 IN X W17 IN COTTON PREWASH DELINT HIGH ABSORBENT BLUE (Other) ×1 IMPLANT
TRAY SURGICAL HAND UPPER EXTREMITY ~~LOC~~ (Pack) ×1 IMPLANT

## 2023-05-10 NOTE — SLP Progress Note (Signed)
[facility]   Speech Therapy Cancellation Note    Patient:  Austin Reilly MRN#:  [id number]  Unit:  [id number] TOWER MAIN PREOPERATIVE Room/Bed:  FXMAINPREOP/FXMAINPREOP    05/10/2023  Time: 7:09 AM     SLP Cancellation: Visit  SLP Visit Cancellation Reason: Testing/Procedure       Carie Caddy, M.Ed. CCC-SLP #[id number]  05/10/2023
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
## 2023-05-10 NOTE — Plan of Care (Signed)
Problem: Compromised Sensory Perception  Goal: Sensory Perception Interventions  Outcome: Progressing  Flowsheets (Taken 05/10/2023 2000)  Sensory Perception Interventions: Offload heels, Pad bony prominences, Reposition q 2hrs/turn Clock, Q2 hour skin assessment under devices if present     Problem: Compromised Moisture  Goal: Moisture level Interventions  Outcome: Progressing  Flowsheets (Taken 05/10/2023 2000)  Moisture level Interventions: Moisture wicking products, Moisture barrier cream     Problem: Compromised Activity/Mobility  Goal: Activity/Mobility Interventions  Outcome: Progressing  Flowsheets (Taken 05/10/2023 2000)  Activity/Mobility Interventions: Pad bony prominences, TAP Seated positioning system when OOB, Promote PMP, Reposition q 2 hrs / turn clock, Offload heels

## 2023-05-10 NOTE — Anesthesia Postprocedure Evaluation (Signed)
Anesthesia Post Evaluation    Patient: Austin Reilly    Procedure(s):  OPEN TREATMENT, INTERNAL FIXATION, RADIAL AND ULNAR SHAFT FRACTURES    Anesthesia type: general    Last Vitals:   Vitals Value Taken Time   BP 150/78 05/10/23 1310   Temp 36.6 C (97.8 F) 05/10/23 1310   Pulse 100 05/10/23 1310   Resp 20 05/10/23 1310   SpO2 98 % 05/10/23 1310                 Anesthesia Post Evaluation:     Patient Evaluated: bedside  Patient Participation: complete - patient participated  Level of Consciousness: awake  Pain Score: 0  Pain Management: adequate  Multimodal analgesia pain management approach  Strategies: acetaminophen and regional block  Airway Patency: patent  Two or more mitigation strategies used for obstructive sleep apnea.  Strategies: multimodal analgesia, awake extubation, extubation/recovery carried out in nonsupine position and verification of full NMB reversal    Anesthetic complications: No      PONV Status: none    Cardiovascular status: acceptable  Respiratory status: acceptable  Hydration status: acceptable          Signed by: Jenne Campus, MD, 05/10/2023 3:26 PM
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
## 2023-05-10 NOTE — Anesthesia Preprocedure Evaluation (Signed)
Anesthesia Evaluation    AIRWAY    Mallampati: III    TM distance: >3 FB  Neck ROM: full  Mouth Opening:full   CARDIOVASCULAR    regular       DENTAL          Dental Comments: Nothing loose         PULMONARY    clear to auscultation     OTHER FINDINGS                                        Relevant Problems   CARDIO   (+) Injury of renal vein, right, initial encounter      GU/RENAL   (+) Laceration of right kidney               Anesthesia Plan    ASA 3     general                     Detailed anesthesia plan: general endotracheal        Post op pain management: per surgeon    informed consent obtained    Plan discussed with CRNA.    ECG reviewed  pertinent labs reviewed               Signed by: Jenne Campus, MD 05/10/23 7:59 AM

## 2023-05-10 NOTE — Anesthesia Procedure Notes (Signed)
Peripheral Nerve Block    Patient location during procedure: Pre-Op  Reason for block: Post-op pain management      Injection technique: Single Shot  Block Region: Supraclavicular  Laterality: Right      Block at surgeon's request: Yes          Staffing  Anesthesiologist: Jenne Campus, MD  Performed: Anesthesiologist       Pre-procedure Checklist   Completed: patient identified, surgical consent, pre-op evaluation, timeout performed, risks and benefits discussed, anesthesia consent given and correct site        Peripheral Block  Patient monitoring: NIBP, EKG, Pulse oximetry and Nasal cannula O2  Patient position: Other  Premedication: Yes and Meaningful contact maintained  Local infiltration: Lidocaine 1%  Sterile technique: Chloraprep, Mask and Sterile gloves      Needle  Needle type: Stim needle   Needle gauge: 20 G  Needle length: 10 cm      Guidance: ultrasound; image taken and retained in medical record  Ultrasound Guided: LA spread visualized, Image stored or printed, Needle visualized and Relevant anatomy identified (nerve, vessels, muscle)          Assessment   Incremental injection: yes  Injection made incrementally with aspirations every 5 mL.  Injection Resistance: no  Paresthesia Pain: no    Blood Aspirated: No  no suspected intravascular injection  Block Outcome: No complications, Successful block and Pain improved
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
## 2023-05-10 NOTE — Progress Notes (Signed)
Assisted Dr Rolena Infante with placing right supraclavicular peripheral nerve block (single shot) in preop. Meaningful verbal communication maintained throughout procedure.Vital signs monitored and charted throughout procedure. Sterile protocol maintained. Patient tolerated procedure well. Discussed expected outcomes, (pain control,decreased opioid requirements and side effects, limb safety). Patient verbalized understanding.   No signs and symptoms of LAST noted

## 2023-05-10 NOTE — Op Note (Signed)
Date of Procedure: 05/10/23    Preoperative diagnosis:   Closed Displaced right Both bones forearm fracture    Postoperative diagnosis: same    Procedure:  Open reduction internal fixation right both bone forearm   fracture, 25575.    Surgeon: Emiliano Dyer MD ([id number]- Orthopaedic Trauma)    Resident/physician assistant: Woodfin Ganja, MD    Estimated blood loss: minimal    Anesthesia: General plus regional block    Complications: None    Indications for procedure: Please see Ortho Trauma consultation/H&P and/or outpatient clinic notes for details related to patient's presentation.  I have discussed the indications, risks, benefits, possibility of multiple staged surgeries, and anticipated healing and rehab course regarding this injury and surgery.  Additionally, I have discussed realistic short, intermediate, and long term outcomes related to the current clinical presentation.  Surgical consent was obtained.     Risks of surgery: Pain, infection, nerve and/or vascular injury, bleeding, hardware problems or failure, arthritis, avascular necrosis, malunion, nonunion, refracture, dislocation, leg length inequality, malalignment, complex regional pain syndrome, neuropathy, anemia, medical complications including but not limited to cardiovascular, respiratory, neurologic, blood clots, and death.    Description of procedure: The patient was identified in the preoperative holding area, the correct limb was marked, and the consent was verified.  The patient was transferred to the operating room, anesthesia was administered, then they were positioned supine. All bony prominences were well padded and the patient was secured to the operating table. A tourniquet was applied and the limb was prepped and draped in normal sterile fashion.  A final timeout was performed and all parties were in agreement.  The arm was elevated, exsanguinated with an Esmarch bandage, tourniquet was raised to 250 mmHg.      A volar Sherilyn Cooter approach was  performed to the radius.  Sharp dissection was carried down to the fascia, which was incised and the interval between the brachioradialis and the FCR was identified and split.  The radial artery and superficial radial nerve were both identified and protected throughout the case.  The volar musculature was elevated off the radial shaft and the fracture was cleaned of interposed hematoma and overhanging periosteum.  It was open reduced and clamped anatomically. The fracture was stabilized provisionally with Synthes mini fragment fixation and then definitively fixed with a Synthes small fragment LCDC plate in neutralization mode.  X-rays confirmed nice alignment.      Attention was turned to the ulna and a standard subcutaneous approach was performed splitting the interval between the ECU and FCU muscles.  The fracture was cleaned of interposed hematoma and overhanging periosteum.  The ulna fracture was open reduced and stabilized provisionally with Synthes mini fragment fixation.  Then, the fracture was definitively fixed with a Synthes small fragment LCDC plate in neutralization mode.  Final x-rays confirmed that the fractures were well aligned with appropriate sizing and placement of all hardware.      The arm was taken through a range of motion and had full flexion and extension at the wrist and elbow as well as full pronation and supination of the forearm.  Tourniquet was let down and hemostasis was achieved.  The wound was copiously irrigated and closed using 2-0 monocryl sutures in the fascial and subcutaneous tissue layers.  Skin was closed with staples.  Sterile dressings were applied. They tolerated the procedure well with no complication.    Postop Disposition:   - WBAT to this upper extremity x 6 weeks  - Active/Passive range  of motion as tolerated

## 2023-05-10 NOTE — Progress Notes (Addendum)
Orthopedic Trauma Daily Progress Note    05/10/2023 5:12 AM    Austin Reilly is a 52 y.o. male who  sustained a right both bone forearm fracture, pelvic fractures, and right clavicle fractures .     Subjective: Complains of mild pain in right forearm. Patient Denies nausea, vomiting, or fevers.     Physical Exam:  Vitals:    05/10/23 0400   BP: 121/70   Pulse: 82   Resp: (!) 28   Temp:    SpO2: 95%        Intake/Output Summary (Last 24 hours) at 05/10/2023 0512  Last data filed at 05/10/2023 0400  Gross per 24 hour   Intake 630 ml   Output 1915 ml   Net -1285 ml     Lab Results   Component Value Date    INR 1.1 05/01/2023       right upper extremity:  Splint clean/dry/intact  +EPL, FPL, DI  Sensation intact light touch Rad/Med/Ulnar nerves  No pain with passive ROM digits    Lab Results   Component Value Date    HGB 11.1 (L) 05/10/2023    HGB 11.7 (L) 05/09/2023    HGB 9.5 (L) 05/08/2023      Lab Results   Component Value Date    HCT 31.9 (L) 05/10/2023    HCT 33.8 (L) 05/09/2023    HCT 27.9 (L) 05/08/2023    Review of pertinent labs demonstrate:   - Hemodynamic stability with no significant changes.            Assessment: 52 y.o. male with multiple orthopedic injuries, right both bone forearm fracture to be managed operatively.    Plan:   - Mobility: Out of bed as tolerated with PT/OT   - Pain control: Continue to wean/titrate to appropriate oral regimen   - DVT Prophylaxis (per Ortho Trauma service Protocol): Ortho Trauma Consult patient: - DVT prophylaxis not indicated from an Orthopaedic perspective   - Foley catheter status: Per trauma   - Further surgical plans: NPO for OR today, pending Sodium (currently pending in lab)    - RUE: NWB   - LUE:  WBAT   - RLE:  WBAT   - LLE:  WBAT   - No transfusion indicated, but will continue to follow serial labs and titrate IV fluids & volume expanders as indicated to optimize hemodynamics/volume status.   - Disposition: Pending OR    Simeon Craft, MD    Attending  Addendum/Attestation:    I have personally seen and examined this patient and have participated in their care. I have read and agree with the clinical information, including the physical exam and patient history as documented by the Resident.  The above note has been edited to reflect my corrections and findings, incorporate supplementary history and exam, and edit the Assessment and Plan as needed.    Na improved to 130, cleared by trauma, consent obtained for surgery, all agree to proceed w surgery today    Emiliano Dyer, MD  Orthopaedic Trauma  Pager [phone]  Office [phone]

## 2023-05-10 NOTE — Transfer of Care (Signed)
Anesthesia Transfer of Care Note    Patient: Yazeed Culver    Procedures performed: Procedure(s):  OPEN TREATMENT, INTERNAL FIXATION, RADIAL AND ULNAR SHAFT FRACTURES    Anesthesia type: General ETT    Patient location:Phase I PACU    Last vitals:   Vitals:    05/10/23 1114   BP: 119/77   Pulse: (!) 101   Resp: (!) 24   Temp: 36.7 C (98 F)   SpO2: 98%       Post pain: Patient not complaining of pain, continue current therapy      Mental Status:awake and alert     Respiratory Function: tolerating face mask    Cardiovascular: stable    Nausea/Vomiting: patient not complaining of nausea or vomiting    Hydration Status: adequate    Post assessment: no apparent anesthetic complications, no reportable events, and no evidence of recall    Signed by: Leeroy Bock, CRNA  05/10/23 11:15 AM

## 2023-05-11 ENCOUNTER — Inpatient Hospital Stay: Payer: Medicaid Other

## 2023-05-11 ENCOUNTER — Encounter: Payer: Self-pay | Admitting: Orthopaedic Surgery

## 2023-05-11 LAB — CBC
Absolute nRBC: 0 10*3/uL (ref <=0.00)
Hematocrit: 32.8 % — ABNORMAL LOW (ref 37.6–49.6)
Hemoglobin: 11.5 g/dL — ABNORMAL LOW (ref 12.5–17.1)
MCH: 32.1 pg (ref 25.1–33.5)
MCHC: 35.1 g/dL (ref 31.5–35.8)
MCV: 91.6 fL (ref 78.0–96.0)
MPV: 9.4 fL (ref 8.9–12.5)
Platelet Count: 632 10*3/uL — ABNORMAL HIGH (ref 142–346)
RBC: 3.58 10*6/uL — ABNORMAL LOW (ref 4.20–5.90)
RDW: 14 % (ref 11–15)
WBC: 18.95 10*3/uL — ABNORMAL HIGH (ref 3.10–9.50)
nRBC %: 0 /100 WBC (ref <=0.0)

## 2023-05-11 LAB — URINALYSIS WITH REFLEX TO MICROSCOPIC EXAM - REFLEX TO CULTURE
Urine Bilirubin: NEGATIVE
Urine Blood: NEGATIVE
Urine Glucose: NEGATIVE
Urine Ketones: NEGATIVE mg/dL
Urine Leukocyte Esterase: NEGATIVE
Urine Nitrite: NEGATIVE
Urine Specific Gravity: 1.022 (ref 1.001–1.035)
Urine Urobilinogen: >12 mg/dL — AB (ref 0.2–2.0)
Urine pH: 6.5 (ref 5.0–8.0)

## 2023-05-11 LAB — HEPATIC FUNCTION PANEL (LFT)
ALT: 54 U/L (ref 0–55)
AST (SGOT): 78 U/L — ABNORMAL HIGH (ref 5–41)
Albumin/Globulin Ratio: 0.6 — ABNORMAL LOW (ref 0.9–2.2)
Albumin: 3 g/dL — ABNORMAL LOW (ref 3.5–5.0)
Alkaline Phosphatase: 204 U/L — ABNORMAL HIGH (ref 37–117)
Bilirubin Direct: 0.6 mg/dL — ABNORMAL HIGH (ref 0.0–0.5)
Bilirubin Indirect: 0.7 mg/dL (ref 0.2–1.0)
Bilirubin, Total: 1.3 mg/dL — ABNORMAL HIGH (ref 0.2–1.2)
Globulin: 4.9 g/dL — ABNORMAL HIGH (ref 2.0–3.6)
Protein, Total: 7.9 g/dL (ref 6.0–8.3)

## 2023-05-11 LAB — BASIC METABOLIC PANEL
Anion Gap: 9 (ref 5.0–15.0)
BUN: 14 mg/dL (ref 9–28)
CO2: 22 mEq/L (ref 17–29)
Calcium: 9.2 mg/dL (ref 8.5–10.5)
Chloride: 97 mEq/L — ABNORMAL LOW (ref 99–111)
Creatinine: 0.6 mg/dL (ref 0.5–1.5)
GFR: >60 mL/min/{1.73_m2} (ref >=60.0)
Glucose: 129 mg/dL — ABNORMAL HIGH (ref 70–100)
Potassium: 3.9 mEq/L (ref 3.5–5.3)
Sodium: 128 mEq/L — ABNORMAL LOW (ref 135–145)

## 2023-05-11 LAB — MAGNESIUM: Magnesium: 1.7 mg/dL (ref 1.6–2.6)

## 2023-05-11 LAB — PHOSPHORUS: Phosphorus: 2.8 mg/dL (ref 2.3–4.7)

## 2023-05-11 LAB — LAB USE ONLY - URINE GRAY CULTURE HOLD TUBE

## 2023-05-11 MED ORDER — THIAMINE (VITAMIN B1) 100 MG PO TABS (WRAP)
100.0000 mg | ORAL_TABLET | Freq: Every day | ORAL | Status: DC
Start: 2023-05-11 — End: 2023-05-16
  Administered 2023-05-11 – 2023-05-16 (×6): 100 mg via ORAL
  Filled 2023-05-11 (×6): qty 1

## 2023-05-11 MED ORDER — BARIUM SULFATE 40 % PO PSTE
15.0000 mL | PASTE | Freq: Once | ORAL | Status: AC | PRN
Start: 2023-05-11 — End: 2023-05-11
  Administered 2023-05-11: 15 mL via ORAL

## 2023-05-11 MED ORDER — TAB-A-VITE/BETA CAROTENE PO TABS
1.0000 | ORAL_TABLET | Freq: Every day | ORAL | Status: DC
Start: 2023-05-11 — End: 2023-05-16
  Administered 2023-05-11 – 2023-05-16 (×6): 1 via ORAL
  Filled 2023-05-11 (×6): qty 1

## 2023-05-11 MED ORDER — BARIUM SULFATE 40 % PO SUSR
50.0000 mL | Freq: Once | ORAL | Status: AC | PRN
Start: 2023-05-11 — End: 2023-05-11
  Administered 2023-05-11: 50 mL via ORAL

## 2023-05-11 MED ORDER — FOLIC ACID 1 MG PO TABS
1.0000 mg | ORAL_TABLET | Freq: Every day | ORAL | Status: DC
Start: 2023-05-11 — End: 2023-05-16
  Administered 2023-05-11 – 2023-05-16 (×6): 1 mg via ORAL
  Filled 2023-05-11 (×6): qty 1

## 2023-05-11 NOTE — SLP Eval Note (Signed)
[facility]   Speech and Language Pathology  SLP Videofluoroscopic Swallowing Study Report    Patient: Austin Reilly    MRN#: [id number]   Unit: [facility] TOWER 3  Room: F329/F329.01    Plan/Recommendations:   Solid Recommendations: Soft and Bite Sized (IDDSI SB6)  Liquid Recommendations: Thin Liquids (IDDSI TN0) via, any modality  Recommended Form of Meds: PO, whole, with liquid   Precautions/Compensations: Upright at 90 degrees, small/single sips, slow rate of eating, verbal cues for impulsivity  Supervision: Close supervision    Referrals: none    Plan of care: begin/continue oral diet, precautions and strategy training, and patient/family education  Frequency Recommended: 3-4x/wk  Discharge recommendations: Ongoing SLP at next LOC, Defer to PT/OT recommendation    Assessment:   Justin Alcantar presents with mild pharyngeal dysphagia without evidence of aspiration across all PO trials including with large, continuous, consecutive sips of thin liquids by cup and straw. Patient with tendency to consume large sips of liquids despite cueing from clinician; piecemeal deglutition subsequently demonstrated secondary to bolus size - on second swallow, patient with persistent laryngeal penetration during the swallow which largely clears with completion of swallow and does not pass the level of the vocal folds or result in detectable vestibular residue. Oral phase grossly functional.    Recommend initiation of a soft and bite sized diet/thin liquids  with close supervision and verbal cues to ensure reduced impulsivity with self-feeding. If patient with adherence to precautions, will likely advance to baseline diet of regular/thin. SLP to continue to follow throughout acute stay for dysphagia needs.      Relevant anatomical findings (confirmed by radiologist): N/A    Therapy Diagnosis: Pharyngeal dysphagia    Prognosis: good    History of Present Illness:   Medical Diagnosis: Critical  polytrauma [T07.XXXA]    History of Intubation: Yes, multiple intubations. Date extubated: 05/08/23, Length of intubation: 7 days total, ETT size: 7.5     Reason for study: concern for aspiration    Subjective:   Patient is agreeable to participation in the therapy session. Nursing clears patient for therapy. RN accompanies patient to radiology suite.  Patient Status/Behavior: patient is alert    PAIN: yes Pain rating: Location: arm , Intervention: RN notified and RN previously aware    Objective:   Patient Status:    - Diet Prior to Study: NPO/short-term alternate means of nutrition   - Position: Lateral, upright   - Medical equipment in place: telemetry, IV, corpak, and chest tube   - Respiratory Status: room air    - Interpreter services:  Spanish speaking clinician   - Precautions: fall   - Radiologist: Alton Revere, MD    Oral trials:  Thin liquid (TN 0): 50 cc via Spoon, Cup, and Straw  Pureed (PU4): 15 cc   Hard Solids (RG7): cookie    Oral phase:(MBSImP rating)  Lip closure 0-4 0 = No labial escape   Tongue control during bolus hold 0-3 0 = Cohesive bolus between tongue to palatal seal   Bolus preparation/mastication 0-3 0 = Timely and efficient chewing and mashing   Bolus transport/lingual motion 0-4 0 = Brisk tongue motion   Oral residue 0-4 0 = Complete oral clearance   Location of oral residue  N/A   Initiation of pharyngeal swallow 0-4 1 = Bolus head in valleculae     Pharyngeal phase: (MBSImP rating)  Soft palate elevation 0-4 0 = No bolus between soft palate and  pharyngeal wall   Laryngeal elevation 0-3 0 = Complete superior movement of thyroid cartilage with complete approximation of arytenoids to epiglottic petiole   Anterior hyoid excursion 0-2 0 = Complete anterior movement    Epiglottic movement 0-2 1 = Partial inversion   Laryngeal vestibular closure 0-2 1 = Incomplete; narrow column of air/contrast in laryngeal vestibule   Pharyngeal stripping wave 0-2 0 = Present--complete   Pharyngeal  contraction 0-3 N/A; no A/P view obtained   Pharyngoesophageal segment opening 0-3 0 = Complete distension and complete duration; no obstruction of flow   Tongue base retraction 0-4 1 = Trace column of contrast or air between tongue base and pharyngeal wall   Pharyngeal residue  0-4 2 = Collection of residue within or on pharyngeal structures   Location of pharyngeal residue  B = Valleculae     Esophageal phase: (MBS Imp rating)  Esophageal clearance (0-4): 0 = Complete clearance; esophageal coating    Penetration Aspiration Scale  Thin liquid (TN0): 2: Material enters in airway, remains above vocal folds, and is ejected from airway.  Puree (PU4): 1: Material does not enter airway.  Regular Solid (RG7): 1: Material does not enter airway.    Dysphagia Outcome Severity Scale (DOSS):  Level 5: Mild dysphagia: Distant supervision, may need one diet consistency restricted     Level 5: Mild dysphagia: Distant supervision, may need one diet consistency restricted (May exhibit one or more of the following: aspiration of thin liquids only but with strong reflexive cough to clear completely, airway penetration midway to cords with one or more consistency or to cords with one consistency but clears spontaneously, retention in pharynx that is cleared spontaneously, mild oral dysphagia with reduced mastication and/or oral retention that is cleared spontaneously)    Compensatory strategies  Effective strategies: n/a  Ineffective strategies: n/a    Patient/Family Education: Verbal to patient and RN; understanding voiced.    Goals:  Patient will participate in ongoing therapeutic diagnostic PO trials to assess candidacy for oral diet vs determine need/benefit for instrumental assessment x1 session. MET  Pt will complete instrumental swallow assessment to further assess swallow physiology, aspiration risk , and determine safest least restrictive PO diet x1 session. MET  Patient will tolerate a soft and bite sized diet/thin liquids  without overt s/s of aspiration or acute changes in respiratory status x24-48 hours. NEW  Patient will implement small, single sips and bites with min cueing in 5/5 opportunities. NEW    Armando Gang, MS, CCC-SLP  Speech-Language Pathologist  Available via Secure Chat    PPE Worn by Provider: gloves     Tech present for transfers: Reda Hajjaoui   PPE worn by tech: gloves and face mask     Time of Treatment:  SLP Received On: 05/11/23  Start Time: 1130  Stop Time: 1200  Time Calculation (min): 30 min

## 2023-05-11 NOTE — Progress Notes - Trauma (Signed)
TACS Nursing Progress Note    Chey Brendle is a 52 y.o. male  Admitted 05/01/2023  9:45 PM [facility] day 10) for Critical polytrauma [T07.XXXA]      Major Shift Events:  Pain managed with scheduled and pain  RR noted to be >20 MD notified - chest xray ordered - no new orders    Review of Systems  Neuro:  AO4   FC  MAE  Q4 neuro  X2 assist   Spanish speaking     Cardiac:  BP 129/73   Pulse 95   Temp 100.3 F (37.9 C) (Oral)   Resp (!) 27   Ht 1.575 m (5' 2.01")   Wt 81 kg (178 lb 9.2 oz)   SpO2 99%   BMI 32.65 kg/m       Respiratory:     R CT H2O suction    GI/GU:  NPO - ice chips w/ RN   Corpak @ 60  Continuous TF @ 40 - Pivot 1.5  Voids to urinal   LBM 8/13    BM this shift? No      Psycho/Social:  Irritable       Disposition for Discharge:tbd

## 2023-05-11 NOTE — Progress Notes (Incomplete)
TACS Nursing Progress Note    Austin Reilly is a 52 y.o. male  Admitted 05/01/2023  9:45 PM [facility] day 10) for Critical polytrauma [T07.XXXA]        Major Shift Events:  SLP video study eval completed. Diet advanced per recommendations. Tolerating well.   PT/OT consult rec A.R     Review of Systems  Neuro:  A&O X4  Follows commands  Moves all extremities   RUE 3 /5, SPLINTED   BLE 4  /5   Pain Managed with scheduled medications  X2 Assist     Cardiac:  Edema: RUE  SCDs: Yes  Afebrile   Temp:  [97.8 F (36.6 C)-100.3 F (37.9 C)]   Heart Rate:  [92-108]   Resp Rate:  [19-33]   BP: (113-159)/(58-102)   SpO2:  [93 %-99 %]   Weight:  [78.5 kg (173 lb 1 oz)]   BMI (calculated):  [31.6]     Last recorded pain score:  Pain Scale Used: Numeric Scale (0-10)  Pain Score: 4-moderate pain        Respiratory:  Room Air  Lung sounds: diminished and rhonchi   IS achieved: 1100      GI/GU:  Voids: urinal, AUOP  Meds: crushed via corepack L nare @ 60  Diet:Soft/bite sized & Tube feeds- Pivot 1.5 @40cc  (tolerating well)   Last BM:7/15 this shift- Type 7  Lines/Drains: R chest tube- waterseal (serous/serosang)       Skin Assessment  Skin Assessment: Abrasion, Bruising, Laceration  Abrasion Skin Location: l buttocks  Blister Skin Location: r heel  Bruising Skin Location: scattered, r flank     Braden Scale Score: 16 (05/11/23 0813)      LDAWs  Patient Lines/Drains/Airways Status       Active Lines, Drains and Airways       Name Placement date Placement time Site Days    Peripheral IV 05/05/23 20 G Left Hand 05/05/23  2242  Hand  5    Peripheral IV 05/08/23 20 G Left Antecubital 05/08/23  1356  Antecubital  2    Chest Tube 1 Right Midaxillary 05/03/23  2030  Midaxillary  7    Feeding Tube NG 12 Fr. Left nare 05/08/23  0934  Left nare  3                   Wound 05/10/23 Incision Arm Right (Active)   Date First Assessed/Time First Assessed: 05/10/23 0924   Primary Wound Type: Incision  Location: Arm  Wound Location  Orientation: Right  Wound Description: xeroform, 4x4s, ABD, ACE      Assessments 05/10/2023 11:11 AM 05/10/2023  1:10 PM   Wound Base Description Dressing Covering Site (UTA) Dressing Covering Site (UTA)   Peri-wound Description Other (Comment) Other (Comment)   Drainage Amount None None   Dressing Ace Wrap;Gauze Ace Wrap;Gauze       No associated orders.       Patient admitted/transferred to this unit this shift? No    Indication for Central Access and estimated target removal date?  N/a    Indication for Foley and estimated target removal date?  N/a    Psycho/Social:  Calm and cooperative    Interpreter Services:  Does the patient require an Interpreter? No    If so, what type of interpreter was used? N/A    If the family was utilized, is the interpreter waiver signed and located in the chart? N/A  Disposition for Discharge:

## 2023-05-11 NOTE — Discharge Instr - Diet (Signed)
Speech-Language Pathology evaluated swallowing during this admission and completed the following: Videofluoroscopy Swallow Study (VFSS).  This resulted in a diet recommendation of Soft and Bite Sized (IDDSI SB6) solids with Thin (IDDSI TN0) liquids.      Soft & Bite-Sized food is recommended if you are not able to bite off pieces of food safely but are able to chew bite-sized pieces down into little pieces that are safe to swallow. The pieces are 'bite-sized' to reduce choking risk. Soft & Bite-Sized foods are eaten using a fork, spoon or chopsticks.     Thin liquids are recommended if you do not have a swallowing problem with liquids. Water, milk, tea, coffee, and juice are all examples of the Level 0 Thin thickness level. Thin liquids can be taken through a straw or standard cup.

## 2023-05-11 NOTE — Progress Notes (Signed)
TACS Nursing Progress Note    Austin Reilly is a 52 y.o. male  Admitted 05/01/2023  9:45 PM [facility] day 10) for Critical polytrauma [T07.XXXA]        Major Shift Events:  SLP video study eval completed. Diet advanced per recommendations. Tolerating well.   PT/OT consult rec A.R     Review of Systems  Neuro:  A&O X4  Follows commands  Moves all extremities   RUE 3 /5, SPLINTED   BLE 4  /5   Pain Managed with scheduled medications  X2 Assist     Cardiac:  Edema: RUE  SCDs: Yes  Afebrile   Temp:  [97.8 F (36.6 C)-100.3 F (37.9 C)]   Heart Rate:  [92-108]   Resp Rate:  [19-33]   BP: (113-159)/(58-102)   SpO2:  [93 %-99 %]   Weight:  [78.5 kg (173 lb 1 oz)]   BMI (calculated):  [31.6]     Last recorded pain score:  Pain Scale Used: Numeric Scale (0-10)  Pain Score: 4-moderate pain        Respiratory:  Room Air  Lung sounds: diminished and rhonchi   IS achieved: 1100      GI/GU:  Voids: urinal, AUOP  Meds: crushed via corepack L nare @ 60  Diet:Soft/bite sized & Tube feeds- Pivot 1.5 @40cc  (tolerating well)   Last BM:7/15 this shift- Type 7  Lines/Drains: R chest tube- waterseal (serous/serosang)       Skin Assessment  Skin Assessment: Abrasion, Bruising, Laceration  Abrasion Skin Location: l buttocks  Blister Skin Location: r heel  Bruising Skin Location: scattered, r flank     Braden Scale Score: 16 (05/11/23 0813)      LDAWs  Patient Lines/Drains/Airways Status       Active Lines, Drains and Airways       Name Placement date Placement time Site Days    Peripheral IV 05/05/23 20 G Left Hand 05/05/23  2242  Hand  5    Peripheral IV 05/08/23 20 G Left Antecubital 05/08/23  1356  Antecubital  3    Chest Tube 1 Right Midaxillary 05/03/23  2030  Midaxillary  7    Feeding Tube NG 12 Fr. Left nare 05/08/23  0934  Left nare  3                   Wound 05/10/23 Incision Arm Right (Active)   Date First Assessed/Time First Assessed: 05/10/23 0924   Primary Wound Type: Incision  Location: Arm  Wound Location  Orientation: Right  Wound Description: xeroform, 4x4s, ABD, ACE      Assessments 05/10/2023 11:11 AM 05/10/2023  1:10 PM   Wound Base Description Dressing Covering Site (UTA) Dressing Covering Site (UTA)   Peri-wound Description Other (Comment) Other (Comment)   Drainage Amount None None   Dressing Ace Wrap;Gauze Ace Wrap;Gauze       No associated orders.       Patient admitted/transferred to this unit this shift? No    Indication for Central Access and estimated target removal date?  N/a    Indication for Foley and estimated target removal date?  N/a    Psycho/Social:  Calm and cooperative    Interpreter Services:  Does the patient require an Interpreter? No    If so, what type of interpreter was used? N/A    If the family was utilized, is the interpreter waiver signed and located in the chart? N/A  Disposition for Discharge:

## 2023-05-11 NOTE — Plan of Care (Signed)
Problem: Moderate/High Fall Risk Score >5  Goal: Patient will remain free of falls  Outcome: Progressing  Flowsheets (Taken 05/11/2023 0813)  High (Greater than 13):   HIGH-Consider use of low bed   HIGH-Initiate use of floor mats as appropriate   HIGH-Utilize chair pad alarm for patient while in the chair   HIGH-Bed alarm on at all times while patient in bed   HIGH-Visual cue at entrance to patient's room   LOW-Fall Interventions Appropriate for Low Fall Risk   LOW-Anticoagulation education for injury risk   MOD-Consider a move closer to Nurses Station   MOD-Remain with patient during toileting   MOD-Use of assistive devices -Bedside Commode if appropriate   MOD-Include family in multidisciplinary POC discussions     Problem: Pain interferes with ability to perform ADL  Goal: Pain at adequate level as identified by patient  Outcome: Progressing  Flowsheets (Taken 05/09/2023 2000 by Rise Patience, RN)  Pain at adequate level as identified by patient:   Identify patient comfort function goal   Assess for risk of opioid induced respiratory depression, including snoring/sleep apnea. Alert healthcare team of risk factors identified.   Assess pain on admission, during daily assessment and/or before any "as needed" intervention(s)   Reassess pain within 30-60 minutes of any procedure/intervention, per Pain Assessment, Intervention, Reassessment (AIR) Cycle   Evaluate if patient comfort function goal is met   Offer non-pharmacological pain management interventions   Consult/collaborate with Pain Service   Consult/collaborate with Physical Therapy, Occupational Therapy, and/or Speech Therapy   Include patient/patient care companion in decisions related to pain management as needed   Evaluate patient's satisfaction with pain management progress     Problem: Side Effects from Pain Analgesia  Goal: Patient will experience minimal side effects of analgesic therapy  Outcome: Progressing  Flowsheets (Taken 05/11/2023  1043)  Patient will experience minimal side effects of analgesic therapy:   Monitor/assess patient's respiratory status (RR depth, effort, breath sounds)   Prevent/manage side effects per LIP orders (i.e. nausea, vomiting, pruritus, constipation, urinary retention, etc.)   Assess for changes in cognitive function   Evaluate for opioid-induced sedation with appropriate assessment tool (i.e. POSS)

## 2023-05-11 NOTE — Progress Notes (Addendum)
Orthopedic Trauma Daily Progress Note    05/11/2023 6:19 AM    Austin Reilly is a 52 y.o. male who is now status post right both bone forearm ORIF. DOS 05/10/2023 .     Subjective: States pain is controlled. Patient Denies nausea, vomiting, or fevers.     Physical Exam:  Vitals:    05/11/23 0316   BP: 129/73   Pulse:    Resp:    Temp: 100.3 F (37.9 C)   SpO2:         Intake/Output Summary (Last 24 hours) at 05/11/2023 0102  Last data filed at 05/11/2023 0600  Gross per 24 hour   Intake 2245 ml   Output 2807 ml   Net -562 ml     Lab Results   Component Value Date    INR 1.1 05/01/2023       Dressings clean/dry/intact  +EPL, FPL, DI  Sensation intact light touch Rad/Med/Ulnar nerves  Cap refill < 2 seconds  No pain with passive ROM digits    Lab Results   Component Value Date    HGB 11.5 (L) 05/11/2023    HGB 11.1 (L) 05/10/2023    HGB 11.1 (L) 05/10/2023      Lab Results   Component Value Date    HCT 32.8 (L) 05/11/2023    HCT 32.2 (L) 05/10/2023    HCT 31.9 (L) 05/10/2023    Review of pertinent labs demonstrate:   - Hemodynamic stability with no significant changes.            Assessment: 52 y.o. male now 1 Day Post-Op s/p R BBFF ORIF    Plan:   - Mobility: Out of bed as tolerated with PT/OT   - Pain control: Continue to wean/titrate to appropriate oral regimen   - DVT Prophylaxis (per Ortho Trauma service Protocol): Ortho Trauma Consult patient: - DVT prophylaxis not indicated from an Orthopaedic perspective   - Foley catheter status: Does not have Foley   - Further surgical plans: No further Orthopaedic plans    - RUE: WBAT   - LUE:  WBAT   - RLE:  WBAT   - LLE:  WBAT   - No transfusion indicated, but will continue to follow serial labs and titrate IV fluids & volume expanders as indicated to optimize hemodynamics/volume status.   - Disposition: Pending PT eval    Danella Maiers, MD      Attending Addendum/Attestation:    I have personally seen and examined this patient and have participated in their care.  I have read and agree with the clinical information, including the physical exam and patient history as documented by the Resident.  The above note has been edited to reflect my corrections and findings, incorporate supplementary history and exam, and edit the Assessment and Plan as needed.        Emiliano Dyer, MD  Orthopaedic Trauma  Pager [phone]  Office [phone]

## 2023-05-11 NOTE — Respiratory Progress Note (Signed)
Respiratory Therapy                              Patient Re-Assessment Note/Protocol Order Changes    Patient has been assessed and re-evaluated for the follow therapies:    IP Meds - Nasal and Inhaled (From admission, onward)      Start     Stop Status Route Frequency Ordered    05/09/23 0345  albuterol-ipratropium (DUO-NEB) 2.5-0.5(3) mg/3 mL nebulizer 3 mL         -- Verified NEBULIZATION RT - Every 6 hours as needed 05/09/23 0342                 Current Criteria For Therapy  Secretion Clearance: None indicated  Lung Expansion: Post op upper abd or thoracic surg with IC/VC < 10 mL/kg IBW and pt unable to take a deep breath  Medications: None indicated    Expected Outcomes  Secretion: Decreased secretions       Meds: Decreased thick secretions    Outcomes Met  Secretion: No  Lung Expansion: Yes  Meds: Yes       Reassessment Recommendations  Recommendations: Change/add interventions, Continue current treatment plan    Patient's orders have been modified as follows per RT Patient Driven Protocol.    Self administer PEP therapy       If any questions, please contact the Respiratory Therapist assigned to this patient    Thank you

## 2023-05-11 NOTE — PT Progress Note (Signed)
Physical Therapy Treatment  Austin Reilly  Post Acute Care Therapy Recommendations:     Discharge Recommendations:  Acute Rehab    If Acute Rehab  recommended discharge disposition is not available, patient will need mod assist for mobility and ADL's and HHPT, one level set up, transport into home, below equipment prior to d/c.     DME needs IF patient is discharging home: Wheelchair-manual, BSC, Front wheel walker (RUE platform attachment for walker)    Therapy discharge recommendations may change with patient status.  Please refer to most recent note for up-to-date recommendations.    Patient anticipated to benefit from and to be able to engage in 3 hours of therapy a day for 5 days a week.       Assessment:   Significant Findings: none    Pt s/p ORIF of radius and ulna, new orders received and now WBAT. Pt with improved sit > stand and ambulation of short household distances, grossly min A x2, significant antalgic gait but improved sequencing with cues. Continue AR rec and POC.     Patient left without needs and call bell within reach. RN notified of session outcome.     Treatment Activities: sit <> stand, gait, ther ex    Educated the patient to role of physical therapy, plan of care, goals of therapy and safety with mobility and ADLs, energy conservation techniques, discharge instructions, home safety.    Plan:   PT Frequency: 3-4x/wk    Continue plan of care.    Unit: [facility] TOWER 3  Bed: F329/F329.01     Precautions and Contraindications:   Falls  Spanish speaking - use of in person staff interpreter  WBAT all extremities    Updated Medical Status/Imaging/Labs:   Rad/labs: reviewed  FL Modified Barium Swallow W Speech Therapy    Result Date: 05/11/2023   Fluoroscopic guidance was provided for oropharyngeal swallowing study. Legrand Pitts, MD 05/11/2023 12:07 PM    XR Chest AP Portable    Result Date: 05/10/2023  1.No appreciable pneumothorax with chest tube in place. 2.Stable  bibasilar opacities, likely atelectasis and/or trace pleural effusions. Elease Etienne, DO 05/10/2023 9:56 PM   Lab Results   Component Value Date/Time    HGB 11.5 (L) 05/11/2023 03:19 AM    HCT 32.8 (L) 05/11/2023 03:19 AM    K 3.9 05/11/2023 03:19 AM    NA 128 (L) 05/11/2023 03:19 AM    INR 1.1 05/01/2023 10:04 PM    TROPI 26.0 05/02/2023 06:41 AM    TROPI 18.0 (H) 05/02/2023 03:41 AM     Subjective:    "It hurts here"  Patient's medical condition is appropriate for Physical Therapy intervention at this time.  Patient is agreeable to participation in the therapy session. Nursing clears patient for therapy.    Pain:   Scale: 6/10  Location: hip  Intervention: RN aware, modified mobility, WBAT    Objective:   Patient is in bedside chair with telemetry, pulse ox, BP cuff, chest tube to water seal  in place.  Pt wore mask during therapy session:No      Cognition/Neuro Status  Arousal/Alertness: Appropriate responses to stimuli  Attention Span: Appears intact  Orientation Level: Oriented X4  Memory: Decreased recall of biographical information;Decreased recall of recent events  Following Commands: Follows one step commands with repetition  Orientation Level: Return to [facility]    Functional Mobility:  Rolling: nt, pt seated in bedside chair at start of session  Supine to Sit: nt, pt seated in bedside chair at start of session  Scooting to [facility]: nt, pt seated in bedside chair at start of session  Scooting to EOB: min A  Sit to Stand: min A x 2  Transfers: min A x 2    PMP - Progressive Mobility Protocol   PMP Activity: Step 6 - Walks in Room  Distance Walked (ft) (Step 6,7): 20 Feet    Ambulation:  Level of assistance required: min A x 2  Ambulation Distance: 20 feet  Pattern: shuffled gait, decr'd step length, L antalgic gait, R step-to  Device Used: B HHA  Weightbearing Status: no restrictions  Stair Management: nt, minimal amb    Balance:  Static Sitting: good-  Dynamic Sitting: fair+  Static Standing: fair  Dynamic  Standing: fair    Therapeutic Exercise:   Incorporated throughout  Ankle pumps  LAQ  Sit > stands    Participation and Activity Tolerance  Participation Effort: good  Endurance: fair+    Patient left with call bell within reach, all needs met,   SCDs off (as found, RN aware),   fall mat down,   bed alarm n/a,   chair alarm on   and all questions answered. RN notified of session outcome and patient response.       Goals:  Goals  Goal Formulation: With patient  Time for Goal Acheivement: 7 visits  Goals: Select goal  Pt Will Go Supine To Sit: with contact guard assist, to maximize functional mobility and independence, 7 visits  Pt Will Perform Sit to Stand: with contact guard assist, to maximize functional mobility and independence, 7 visits  Pt Will Transfer Bed/Chair: with contact guard assist, to maximize functional mobility and independence, 7 visits  Pt Will Ambulate: 151-200 feet, with contact guard assist, to maximize functional mobility and independence, 7 visits  Pt Will Go Up / Down Stairs: 1 flight, with minimal assist, With rail, to maximize functional mobility and independence, 7 visits      PPE worn during session: Gloves    Tech present: n/a  PPE worn by tech: n/a      Time of Treatment:   PT Received On: 05/11/23  Start Time: 1205  Stop Time: 1235  Time Calculation (min): 30 min  Treatment # 1 out of 7 visits    Lorene Dy, PT, DPT, CBIS  Pager # [phone]

## 2023-05-11 NOTE — SLP Progress Note (Signed)
[facility]   Speech Language Pathology  Treatment Note    Patient: Trayvon Beno    MRN#: [id number]  Room: F329/F329.01    Treatment Type: Dysphagia     Recommendations/Plan:   Recommendations:  Solids:  NPO, continue, short-term alternate means of nutrition, allow unlimited ice chips under RN supervision  Liquids: NPO  Meds: Non-oral route     Precautions:   Precautions/Compensations: alert and awake, upright positioning, external pacing  Supervision: 1:1 supervision    Plan:   VFSS scheduled for this AM @1130   Referrals: none  SLP Frequency Recommended: 4-5x/wk  Discharge recommendations: Defer to PT/OT recommendation, Ongoing SLP at next LOC    Assessment:   Patient seen for skilled dysphagia reassessment at the bedside; POD #1 s/p R ORIF.    Patient with continued improvement in swallow function this date though with persistent concern for aspiration. Vocal quality improved with increased clarity, breath support for speech, and vocal intensity. PO trials of ice chips and thin liquids by cup completed. Oral phase grossly functional for trials. Pharyngeal phase characterized by palpable hyolaryngeal elevation, 1-2 swallows per bolus, intermittent throat clearing with thin liquids, and clear vocal quality post-swallow. Patient demonstrates impulsive rate of self-feeding.    Patient ready for instrumental swallow evaluation; recommend VFSS this date to determine candidacy for PO diet initiation. In the interim, continue NPO/Corpak but would allow unlimited ice chips for comfort and therapeutic benefit under RN supervision. SLP to continue to follow for dysphagia needs.  Subjective:   Patient is agreeable to participation in the therapy session. Nursing clears patient for therapy.. Patient is alert and agreeable to session. No Family present at bedside.   PAIN: yes, Pain rating: Location: arm, Intervention: RN previously aware    Behavior/cognition:  awake, alert, pleasant, cooperative, and  impulsive    Objective:   Patient Status:    - Current diet: NPO/short-term alternate means of nutrition   - Position: in bed   - Medical equipment in place: telemetry, IV, and corpak   - Respiratory Status: room air    - Interpreter services:  Spanish speaking clinician   - Precautions: fall and aspiration    - Food allergies: none    Dysphagia:   Oral Inspection:   Lips: moist/pink  Tongue: moist/pink  Saliva: WFL  Teeth: WFL  Oral care provided: no  Comments: n/a    PO Trials Presented:   Ice chips  Thin (TN0) via cup    Oral Phase:  WFL     Pharyngeal Phase/Airway Protection:  present hyolaryngeal movement   1-3 swallows per bolus  delayed throat clearing with thin via cup    Vitals:  WFL     Patient Education: verbal    Patient left with call bell within reach, all needs met, SCDs in place, fall mat in place, and bed alarm activated and all questions answered. RN notified of session outcome and patient response.    Goals:   Patient will participate in ongoing therapeutic diagnostic PO trials to assess candidacy for oral diet vs determine need/benefit for instrumental assessment x1 session. MET  Pt will complete instrumental swallow assessment to further assess swallow physiology, aspiration risk , and determine safest least restrictive PO diet x1 session. NEW/ONGOING    Armando Gang, MS, CCC-SLP  Speech-Language Pathologist  Available via Secure Chat    PPE Worn by Provider: gloves     Time of Treatment:  SLP Received On: 05/11/23  Start Time: 3086  Stop  Time: 0926  Time Calculation (min): 11 min
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
## 2023-05-11 NOTE — Progress Notes (Signed)
[facility] Note:   Referral received/Following patient for acute inpatient rehab needs.  Will follow patient for continued acute inpatient rehab appropriateness and medical clearance.   Will need Complex case review closer to discharge    Karen German MSN, RN, GERO-BC, CRRN, ACM  [facility] Liaison   [facility]  [facility]  O [phone]  F [phone]  [email or web address]

## 2023-05-11 NOTE — Plan of Care (Signed)
Problem: Compromised Friction/Shear  Goal: Friction and Shear Interventions  Outcome: Progressing  Flowsheets (Taken 05/11/2023 2000)  Friction and Shear Interventions: Pad bony prominences, Off load heels, HOB 30 degrees or less unless contraindicated, Consider: TAP seated positioning, Heel foams     Problem: Moderate/High Fall Risk Score >5  Goal: Patient will remain free of falls  Outcome: Progressing  Flowsheets (Taken 05/11/2023 2000)  High (Greater than 13):   LOW-Fall Interventions Appropriate for Low Fall Risk   LOW-Anticoagulation education for injury risk   MOD-Use of chair-pad alarm when appropriate   MOD-Consider a move closer to [employer]   MOD-Remain with patient during toileting   MOD-Use of assistive devices -Bedside Commode if appropriate   MOD-Re-orient confused patients   MOD-Utilize diversion activities   MOD-Perform dangle, stand, walk (DSW) prior to mobilization   MOD-Request PT/OT consult order for patients with gait/mobility impairment   MOD-Include family in multidisciplinary POC discussions   MOD-Place Fall Risk level on whiteboard in room   HIGH-Visual cue at entrance to patient's room   HIGH-Bed alarm on at all times while patient in bed   HIGH-Utilize chair pad alarm for patient while in the chair   HIGH-Apply yellow "Fall Risk" arm band   HIGH-Pharmacy to initiate evaluation and intervention per protocol   HIGH-Initiate use of floor mats as appropriate   HIGH-Consider use of low bed     Problem: Pain interferes with ability to perform ADL  Goal: Pain at adequate level as identified by patient  Outcome: Progressing  Flowsheets (Taken 05/09/2023 2000)  Pain at adequate level as identified by patient:   Identify patient comfort function goal   Assess for risk of opioid induced respiratory depression, including snoring/sleep apnea. Alert healthcare team of risk factors identified.   Assess pain on admission, during daily assessment and/or before any "as needed" intervention(s)    Reassess pain within 30-60 minutes of any procedure/intervention, per Pain Assessment, Intervention, Reassessment (AIR) Cycle   Evaluate if patient comfort function goal is met   Offer non-pharmacological pain management interventions   Consult/collaborate with Pain Service   Consult/collaborate with Physical Therapy, Occupational Therapy, and/or Speech Therapy   Include patient/patient care companion in decisions related to pain management as needed   Evaluate patient's satisfaction with pain management progress

## 2023-05-11 NOTE — OT Progress Note (Signed)
Occupational Therapy Treatment  Austin Reilly        Post Acute Care Therapy Recommendations:     Discharge Recommendations:  Acute Rehab    If Acute Rehab  recommended discharge disposition is not available, patient will need hands on assist for functional transfers/ADLs, first floor setup, transport into home and HH OT.     DME needs IF patient is discharging home: [facility] (mobility equipment per PT)    Therapy discharge recommendations may change with patient status.  Please refer to most recent note for up-to-date recommendations.    Patient anticipated to benefit from and to be able to engage in 3 hours of therapy a day for 5 days a week.     Assessment:   Significant Findings: none    Pt received seated in bedside chair, agreeable to OT session. Pt requires minAx2 for all functional transfers from various surfaces. Pt c/o blurry vision and dizziness throughout session - BP WNL. Pt demos increased independence during functional transfers/ADLs compared to initial evaluation. Conts to present with decreased endurance, decreased balance, decreased strength, and decreased safety awareness negatively impacting functional transfers and ADLs. Rec cont OT in order to optimize functional performance.     Treatment Activities: BADL training, functional transfers, pt education, neuro re-ed     Educated the patient to role of occupational therapy, plan of care, goals of therapy and safety with mobility and ADLs.    Plan:    OT Frequency Recommended: 3-4x/wk     Continue plan of care.    Unit: [facility] TOWER 3  Bed: F329/F329.01       Precautions and Contraindications:   Falls   WBAT R UE  WBAT B LEs   Spanish speaking     Updated Medical Status/Imaging/Labs:  Reviewed     Subjective: "I feel dizzy"    Patient's medical condition is appropriate for Occupational Therapy intervention at this time.  Patient is agreeable to participation in the therapy session.    Pain:   Scale: 3/10  Location:  head  Intervention: positioning, modification of session, RN aware     Objective:   Patient is seated in a bedside chair with PIV access, chest tube, and IMC lines/leads in place.  Pt wore mask during therapy session:No      Cognition  Alert   Follows simple commands appropriately     Functional Mobility  Sit to Stand: minAx2  Transfers: minAx2 given hand held assist     PMP Activity: Step 6 - Walks in Room    Balance  Static Sitting: fair+  Dynamic Sitting: fair  Static Standing: poor  Dynamic Standing: poor    Self Care and Home Management  Grooming: modA seated   UE Dressing: modA    Therapeutic Exercises  During ADL participation     Participation: good  Endurance: fair    Patient left with call bell within reach, all needs met, SCDs off as found, fall mat in place, chair alarm on and all questions answered. RN notified of session outcome and patient response.     Goals:  Time For Goal Achievement: 5 visits  ADL Goals  Patient will groom self: Supervision  Patient will dress lower body: Minimal Assist  Patient will toilet: Minimal Assist  Mobility and Transfer Goals  Pt will perform functional transfers: Minimal Assist  Neuro Re-Ed Goals  Pt will perform dynamic standing balance: Minimal Assist, for 5 minutes, to complete standing ADLs safely  PPE worn during session: gloves    Tech present: no  PPE worn by tech: N/A    Mitchel Honour OTR/L, CBIS   Pager [phone]      Time of Treatment  OT Received On: 05/11/23  Start Time: 1140  Stop Time: 1215  Time Calculation (min): 35 min    Treatment # 1 of 5 visits

## 2023-05-12 ENCOUNTER — Inpatient Hospital Stay: Payer: Medicaid Other

## 2023-05-12 DIAGNOSIS — J15211 Pneumonia due to Methicillin susceptible Staphylococcus aureus: Secondary | ICD-10-CM

## 2023-05-12 DIAGNOSIS — S271XXA Traumatic hemothorax, initial encounter: Secondary | ICD-10-CM

## 2023-05-12 LAB — BASIC METABOLIC PANEL
Anion Gap: 7 (ref 5.0–15.0)
BUN: 9 mg/dL (ref 9–28)
CO2: 22 mEq/L (ref 17–29)
Calcium: 9.4 mg/dL (ref 8.5–10.5)
Chloride: 99 mEq/L (ref 99–111)
Creatinine: 0.6 mg/dL (ref 0.5–1.5)
GFR: >60 mL/min/{1.73_m2} (ref >=60.0)
Glucose: 127 mg/dL — ABNORMAL HIGH (ref 70–100)
Potassium: 4 mEq/L (ref 3.5–5.3)
Sodium: 128 mEq/L — ABNORMAL LOW (ref 135–145)

## 2023-05-12 LAB — CBC
Absolute nRBC: 0 10*3/uL (ref <=0.00)
Hematocrit: 31.6 % — ABNORMAL LOW (ref 37.6–49.6)
Hemoglobin: 10.8 g/dL — ABNORMAL LOW (ref 12.5–17.1)
MCH: 32 pg (ref 25.1–33.5)
MCHC: 34.2 g/dL (ref 31.5–35.8)
MCV: 93.5 fL (ref 78.0–96.0)
MPV: 9.6 fL (ref 8.9–12.5)
Platelet Count: 706 10*3/uL — ABNORMAL HIGH (ref 142–346)
RBC: 3.38 10*6/uL — ABNORMAL LOW (ref 4.20–5.90)
RDW: 15 % (ref 11–15)
WBC: 18.21 10*3/uL — ABNORMAL HIGH (ref 3.10–9.50)
nRBC %: 0 /100 WBC (ref <=0.0)

## 2023-05-12 NOTE — Plan of Care (Signed)
Problem: Moderate/High Fall Risk Score >5  Goal: Patient will remain free of falls  Outcome: Progressing  Flowsheets (Taken 05/12/2023 2000)  High (Greater than 13):   HIGH-Visual cue at entrance to patient's room   HIGH-Utilize chair pad alarm for patient while in the chair   HIGH-Bed alarm on at all times while patient in bed   HIGH-Apply yellow "Fall Risk" arm band   HIGH-Pharmacy to initiate evaluation and intervention per protocol   HIGH-Initiate use of floor mats as appropriate   HIGH-Consider use of low bed     Problem: Pain interferes with ability to perform ADL  Goal: Pain at adequate level as identified by patient  Outcome: Progressing  Flowsheets (Taken 05/09/2023 2000 by Rise Patience, RN)  Pain at adequate level as identified by patient:   Identify patient comfort function goal   Assess for risk of opioid induced respiratory depression, including snoring/sleep apnea. Alert healthcare team of risk factors identified.   Assess pain on admission, during daily assessment and/or before any "as needed" intervention(s)   Reassess pain within 30-60 minutes of any procedure/intervention, per Pain Assessment, Intervention, Reassessment (AIR) Cycle   Evaluate if patient comfort function goal is met   Offer non-pharmacological pain management interventions   Consult/collaborate with Pain Service   Consult/collaborate with Physical Therapy, Occupational Therapy, and/or Speech Therapy   Include patient/patient care companion in decisions related to pain management as needed   Evaluate patient's satisfaction with pain management progress     Problem: Side Effects from Pain Analgesia  Goal: Patient will experience minimal side effects of analgesic therapy  Outcome: Progressing  Flowsheets (Taken 05/11/2023 1043 by Dayton Martes, RN)  Patient will experience minimal side effects of analgesic therapy:   Monitor/assess patient's respiratory status (RR depth, effort, breath sounds)   Prevent/manage side  effects per LIP orders (i.e. nausea, vomiting, pruritus, constipation, urinary retention, etc.)   Assess for changes in cognitive function   Evaluate for opioid-induced sedation with appropriate assessment tool (i.e. POSS)     Problem: Neurological Deficit  Goal: Neurological status is stable or improving  Outcome: Progressing  Flowsheets (Taken 05/12/2023 2208)  Neurological status is stable or improving:   Monitor/assess/document neurological assessment (Stroke: every 4 hours)   Observe for seizure activity and initiate seizure precautions if indicated   Re-assess NIH Stroke Scale for any change in status   Perform CAM Assessment

## 2023-05-12 NOTE — Progress Notes (Signed)
TACS Nursing Progress Note    Austin Reilly is a 52 y.o. male  Admitted 05/01/2023  9:45 PM [facility] day 11) for Critical polytrauma [T07.XXXA]        Major Shift Events:  PT/OT consult   CT completed  NGT removed    Review of Systems  Neuro:  A&O X4  Follows commands  Moves all extremities   RUE 3 /5, SPLINTED   BLE 4  /5  Pain Managed with scheduled medications  X2 Assist     Cardiac:  Edema: RUE  SCDs: Yes  Afebrile   Temp:  [97.8 F (36.6 C)-100.3 F (37.9 C)]   Heart Rate:  [92-108]   Resp Rate:  [19-33]   BP: (113-159)/(58-102)   SpO2:  [93 %-99 %]   Weight:  [78.5 kg (173 lb 1 oz)]   BMI (calculated):  [31.6]     Last recorded pain score:  Pain Scale Used: Numeric Scale (0-10)  Pain Score: 4-moderate pain        Respiratory:  Room Air  Lung sounds: diminished and rhonchi   IS achieved: 1100      GI/GU:  Voids: urinal, AUOP  Meds: whole  Diet:Soft/bite sized   Last BM:7/16 this shift- Type 5  Lines/Drains: R chest tube- waterseal (serous/serosang)       Skin Assessment  Skin Assessment: Abrasion, Bruising, Laceration  Abrasion Skin Location: l buttocks  Blister Skin Location: r heel  Bruising Skin Location: scattered, r flank     Braden Scale Score: 16 (05/12/23 0820)      LDAWs  Patient Lines/Drains/Airways Status       Active Lines, Drains and Airways       Name Placement date Placement time Site Days    Peripheral IV 05/08/23 20 G Left Antecubital 05/08/23  1356  Antecubital  4    Chest Tube 1 Right Midaxillary 05/03/23  2030  Midaxillary  8                   Wound 05/10/23 Incision Arm Right (Active)   Date First Assessed/Time First Assessed: 05/10/23 0924   Primary Wound Type: Incision  Location: Arm  Wound Location Orientation: Right  Wound Description: xeroform, 4x4s, ABD, ACE      Assessments 05/10/2023 11:11 AM 05/10/2023  1:10 PM   Wound Base Description Dressing Covering Site (UTA) Dressing Covering Site (UTA)   Peri-wound Description Other (Comment) Other (Comment)   Drainage Amount None  None   Dressing Ace Wrap;Gauze Ace Wrap;Gauze       No associated orders.       Patient admitted/transferred to this unit this shift? No    Indication for Central Access and estimated target removal date?  N/a    Indication for Foley and estimated target removal date?  N/a    Psycho/Social:  Calm and cooperative    Interpreter Services:  Does the patient require an Interpreter? No    If so, what type of interpreter was used? N/A    If the family was utilized, is the interpreter waiver signed and located in the chart? N/A      Disposition for Discharge:

## 2023-05-12 NOTE — PT Progress Note (Signed)
Physical Therapy Treatment  Corinna Gab  Post Acute Care Therapy Recommendations:     Discharge Recommendations:  Acute Rehab    If Acute Rehab  recommended discharge disposition is not available, patient will need mod assist for mobility and ADL's and HHPT, one level set up, transport into home, below equipment prior to d/c.     DME needs IF patient is discharging home: Front wheel walker (with RUE platform attachment for walker)    Therapy discharge recommendations may change with patient status.  Please refer to most recent note for up-to-date recommendations.    Patient anticipated to benefit from and to be able to engage in 3 hours of therapy a day for 5 days a week.       Assessment:   Significant Findings: none    Pt mod A for bed mobility, however progressed in sit > stand and ambulation with use of platform walker, still CGA-min A. Pt with poor safety awareness with turns and fww use, requiring cues throughout. Continue AR rec and POC, however progressing well.     Patient left without needs and call bell within reach. RN notified of session outcome.     Treatment Activities: bed mobility, sit <> stand, gait, AD eval/training    Educated the patient to role of physical therapy, plan of care, goals of therapy and safety with mobility and ADLs, energy conservation techniques, weight bearing precautions, discharge instructions, home safety.    Plan:   PT Frequency: 4-5x/wk    Continue plan of care.    Unit: [facility] TOWER 3  Bed: F329/F329.01     Precautions and Contraindications:   Falls  Spanish speaking - VRI # [id number]    Updated Medical Status/Imaging/Labs:   Rad/labs: reviewed  CT Chest WO Contrast    Result Date: 05/12/2023  1.Right-sided chest tube is in place with significant improvement in the right hydropneumothorax. 2.There is also significant improvement in left basilar atelectasis. Karilyn Cota, MD 05/12/2023 3:05 PM    XR Chest AP Portable    Result Date:  05/12/2023  1. No evidence of pneumothorax following placement of right chest tube to waterseal. 2. Low right lung volume and right basilar atelectasis. 3. Multiple displaced right rib fractures. Colonel Bald, MD 05/12/2023 11:39 AM   Lab Results   Component Value Date/Time    HGB 10.8 (L) 05/12/2023 03:28 AM    HCT 31.6 (L) 05/12/2023 03:28 AM    K 4.0 05/12/2023 03:28 AM    NA 128 (L) 05/12/2023 03:28 AM    INR 1.1 05/01/2023 10:04 PM    TROPI 26.0 05/02/2023 06:41 AM    TROPI 18.0 (H) 05/02/2023 03:41 AM     Subjective:    "I need to poop"  Patient's medical condition is appropriate for Physical Therapy intervention at this time.  Patient is agreeable to participation in the therapy session. Nursing clears patient for therapy.    Pain:   Scale: 4/10  Location: R UE  Intervention: RN aware, modified mobility    Objective:   Patient is in bed with  RUE splint  in place.  Pt wore mask during therapy session:No      Cognition/Neuro Status  Arousal/Alertness: Appropriate responses to stimuli  Attention Span: Appears intact  Orientation Level: Oriented X4  Memory: Decreased recall of biographical information;Decreased recall of recent events  Following Commands: Follows one step commands with repetition  Behavior: calm;cooperative    Functional Mobility:  Rolling: min A  Supine to Sit: mod A  Scooting to HOB: NT  Scooting to EOB: SBA  Sit to Stand: CGA  Transfers: min A, CGA    PMP - Progressive Mobility Protocol   PMP Activity: Step 7 - Walks out of Room  Distance Walked (ft) (Step 6,7): 60 Feet    Ambulation:  Level of assistance required: min A, CGA  Ambulation Distance: 60 feet  Pattern:  antalgic gait, step-to with occ step-through  Device Used: RUE platform walker  Weightbearing Status: BLE WBAT  Stair Management: nt, focus on amb    Balance:  Static Sitting: fair+  Dynamic Sitting: fair+  Static Standing: fair  Dynamic Standing: fair    Participation and Activity Tolerance  Participation Effort:  good  Endurance: fair+    Patient left with call bell within reach, all needs met,   SCDs off (as found, RN aware),   fall mat down,   bed alarm on,   chair alarm n/a   and all questions answered. RN notified of session outcome and patient response.       Goals:  Goals  Goal Formulation: With patient  Time for Goal Acheivement: 7 visits  Goals: Select goal  Pt Will Go Supine To Sit: with contact guard assist, to maximize functional mobility and independence, 7 visits  Pt Will Perform Sit to Stand: with contact guard assist, to maximize functional mobility and independence, 7 visits  Pt Will Transfer Bed/Chair: with contact guard assist, to maximize functional mobility and independence, 7 visits  Pt Will Ambulate: 151-200 feet, with contact guard assist, to maximize functional mobility and independence, 7 visits  Pt Will Go Up / Down Stairs: 1 flight, with minimal assist, With rail, to maximize functional mobility and independence, 7 visits      PPE worn during session: Gloves    Tech present: n/a  PPE worn by tech: n/a      Time of Treatment:   PT Received On: 05/12/23  Start Time: 1455  Stop Time: 1530  Time Calculation (min): 35 min  Treatment # 2 out of 7 visits    Lorene Dy, PT, DPT, CBIS  Pager # [phone]

## 2023-05-12 NOTE — Progress Notes (Signed)
Reviewed pt's chart. Rec for AR. Uninsured. [place] resident. Referral sent to [facility] for charity bed.     Received call from [facility] Gabriel Rung, [phone] stated to needing to know post rehab d/c plan and charity application, will continue to review.     SW met pt at bedside with video translator to discuss d/c plan. Introduced self, role, and reason for visit. Explained to being rec for AR and in process of locating charity bed. Pt initially stated to wanting to d/c to sister's house in [place]. SW explained importance of rehab,pt agreeable to rehab. Explained [facility] currently reviewing and need to know d/c plan from rehab. Pt stated to planning to go to sisters house afterwards. Provided pt with [facility] charity application in Spanish, informed pt of needing application completed and either pay stubs or bank statements. Pt verbalized understanding. Stated family will be at bedside to assist with [facility] application.    Relayed update to Gabriel Rung, able to accept for charity rehab, pending charity application.     Received message from [facility] liaison reviewing pt, informed liaison of pt [place] resident. Reports to unable to accept pt.     Dispo: [facility] pending charity paperwork.     Larita Fife, MSW  Social Worker Case Manager  Care Management Department  [facility]

## 2023-05-12 NOTE — Progress Notes (Addendum)
[facility] Note:   Referral received/Following patient for acute inpatient rehab needs.  Will follow patient for continued acute inpatient rehab appropriateness and medical clearance.   Chest tube will need to be [place].  Also will need to have charity status checked by elevate.   AR discussed with patient and sister who want IMV if available once ready.  Leonette Monarch MSN, RN, [employer], CRRN, [employer]  [facility] Liaison   [facility]  [facility]  [place] [phone]  F [phone]  [email or web address]      Sorry, just saw patient is a [place] resident. We can only take [place] residents for charity.
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
## 2023-05-12 NOTE — Plan of Care (Signed)
Problem: Moderate/High Fall Risk Score >5  Goal: Patient will remain free of falls  Outcome: Progressing  Flowsheets  Taken 05/12/2023 0820 by Dayton Martes, RN  High (Greater than 13):   HIGH-Consider use of low bed   HIGH-Initiate use of floor mats as appropriate   HIGH-Utilize chair pad alarm for patient while in the chair   HIGH-Bed alarm on at all times while patient in bed   HIGH-Visual cue at entrance to patient's room   LOW-Fall Interventions Appropriate for Low Fall Risk   LOW-Anticoagulation education for injury risk   MOD-Consider a move closer to Nurses Station   MOD-Remain with patient during toileting   MOD-Use of assistive devices -Bedside Commode if appropriate   MOD-Include family in multidisciplinary POC discussions  Taken 05/07/2023 1046 by Roosvelt Maser, RN  VH High Risk (Greater than 13):   Use of floor mat   Keep door open for better visibility     Problem: Pain interferes with ability to perform ADL  Goal: Pain at adequate level as identified by patient  Outcome: Progressing  Flowsheets (Taken 05/09/2023 2000 by Rise Patience, RN)  Pain at adequate level as identified by patient:   Identify patient comfort function goal   Assess for risk of opioid induced respiratory depression, including snoring/sleep apnea. Alert healthcare team of risk factors identified.   Assess pain on admission, during daily assessment and/or before any "as needed" intervention(s)   Reassess pain within 30-60 minutes of any procedure/intervention, per Pain Assessment, Intervention, Reassessment (AIR) Cycle   Evaluate if patient comfort function goal is met   Offer non-pharmacological pain management interventions   Consult/collaborate with Pain Service   Consult/collaborate with Physical Therapy, Occupational Therapy, and/or Speech Therapy   Include patient/patient care companion in decisions related to pain management as needed   Evaluate patient's satisfaction with pain management progress     Problem:  Side Effects from Pain Analgesia  Goal: Patient will experience minimal side effects of analgesic therapy  Outcome: Progressing  Flowsheets (Taken 05/11/2023 1043)  Patient will experience minimal side effects of analgesic therapy:   Monitor/assess patient's respiratory status (RR depth, effort, breath sounds)   Prevent/manage side effects per LIP orders (i.e. nausea, vomiting, pruritus, constipation, urinary retention, etc.)   Assess for changes in cognitive function   Evaluate for opioid-induced sedation with appropriate assessment tool (i.e. POSS)

## 2023-05-12 NOTE — SLP Progress Note (Signed)
[facility]   Speech Language Pathology  Treatment Note    Patient: Austin Reilly    MRN#: [id number]  Room: F329/F329.01    Treatment Type: Dysphagia     Recommendations/Plan:   Recommendations:  Solids:  Soft and Bite Sized (IDDSI SB6)  Liquids: Thin Liquids (IDDSI TN0) via, any modality  Meds: PO, whole, with liquid     Precautions:   Precautions/Compensations: alert and awake, upright positioning, external pacing  Supervision: 1:1 supervision    Plan:   Referrals: none  SLP Frequency Recommended: 2-3x/wk  Discharge recommendations: Defer to PT/OT recommendation    Assessment:   Patient seen for skilled dysphagia reassessment at the bedside; POD #2 s/p R ORIF. Patient completed VFSS 8/15 which revealed adequate airway protection with trialed consistencies but patient with poor insight and impulsivity with self-feeding.    RN endorsed frequent need for education re: aspiration precautions this date. Patient received nearly supine in bed, eating soup from family. RN aided in repositioning. Max verbal cues/education re: positioning and slow rate of intake provided. PO trials of soft solids and thin liquids by straw completed. Oral phase with adequate acceptance, mildly prolonged mastication, and adequate oral clearance of bolus. Pharyngeal phase characterized by palpable hyolaryngeal elevation, 1-2 swallows per bolus, no overt s/s of aspiration, and clear vocal quality post-swallow. Patient demonstrates impulsive rate of self-feeding.    Recommend  continued soft and bite sized diet/thin liquids with close supervision and verbal cues to ensure reduced impulsivity with self-feeding. If patient with adherence to precautions, will likely advance to baseline diet of regular/thin. SLP to continue to follow throughout acute stay for dysphagia needs.   Subjective:   Patient is agreeable to participation in the therapy session. Nursing clears patient for therapy.. Patient is alert and agreeable to session.  No family present at bedside.   PAIN: no     Behavior/cognition:  awake, alert, cooperative, and impulsive    Objective:   Patient Status:    - Current diet: Soft and Bite Sized solids(SB6) and Thin liquids (TN0)   - Position: in bed   - Medical equipment in place: telemetry and IV   - Respiratory Status: room air    - Interpreter services:  Spanish speaking clinician   - Precautions: fall and aspiration    - Food allergies: none    Dysphagia:   Oral Inspection:   Lips: moist/pink  Tongue: moist/pink  Saliva: WFL  Teeth: WFL  Oral care provided: no  Comments: n/a    PO Trials Presented:   Thin (TN0) via straw   Soft and Bite Sized (SB6)    Oral Phase:  prolonged but effective mastication     Pharyngeal Phase/Airway Protection:  present hyolaryngeal movement   1-3 swallows per bolus  no overt signs or symptoms of aspiration   Vitals:  WFL     Patient Education: verbal    Patient left with call bell within reach, all needs met, SCDs in place, fall mat in place, and bed alarm activated and all questions answered. RN notified of session outcome and patient response.    Goals:   Patient will participate in ongoing therapeutic diagnostic PO trials to assess candidacy for oral diet vs determine need/benefit for instrumental assessment x1 session. MET  Pt will complete instrumental swallow assessment to further assess swallow physiology, aspiration risk , and determine safest least restrictive PO diet x1 session. MET  Patient will tolerate a soft and bite sized diet/thin liquids without overt  s/s of aspiration or acute changes in respiratory status x24-48 hours. ONGOING  Patient will implement small, single sips and bites with min cueing in 5/5 opportunities. ONGOING    Armando Gang, MS, CCC-SLP  Speech-Language Pathologist  Available via Secure Chat    PPE Worn by Provider: gloves     Time of Treatment:  SLP Received On: 05/12/23  Start Time: 1624  Stop Time: 1640  Time Calculation (min): 16 min

## 2023-05-13 ENCOUNTER — Encounter: Payer: Self-pay | Admitting: Surgery

## 2023-05-13 ENCOUNTER — Inpatient Hospital Stay: Payer: Medicaid Other

## 2023-05-13 DIAGNOSIS — E871 Hypo-osmolality and hyponatremia: Secondary | ICD-10-CM

## 2023-05-13 LAB — URINE OSMOLALITY: Urine Osmolality: 569 mosm/kg (ref 300–1094)

## 2023-05-13 LAB — CBC
Absolute nRBC: 0 10*3/uL (ref <=0.00)
Hematocrit: 30.8 % — ABNORMAL LOW (ref 37.6–49.6)
Hemoglobin: 10.6 g/dL — ABNORMAL LOW (ref 12.5–17.1)
MCH: 32 pg (ref 25.1–33.5)
MCHC: 34.4 g/dL (ref 31.5–35.8)
MCV: 93.1 fL (ref 78.0–96.0)
MPV: 9.3 fL (ref 8.9–12.5)
Platelet Count: 746 10*3/uL — ABNORMAL HIGH (ref 142–346)
RBC: 3.31 10*6/uL — ABNORMAL LOW (ref 4.20–5.90)
RDW: 15 % (ref 11–15)
WBC: 13.97 10*3/uL — ABNORMAL HIGH (ref 3.10–9.50)
nRBC %: 0 /100 WBC (ref <=0.0)

## 2023-05-13 LAB — BASIC METABOLIC PANEL
Anion Gap: 8 (ref 5.0–15.0)
BUN: 13 mg/dL (ref 9–28)
CO2: 22 mEq/L (ref 17–29)
Calcium: 9.5 mg/dL (ref 8.5–10.5)
Chloride: 102 mEq/L (ref 99–111)
Creatinine: 0.6 mg/dL (ref 0.5–1.5)
GFR: >60 mL/min/{1.73_m2} (ref >=60.0)
Glucose: 145 mg/dL — ABNORMAL HIGH (ref 70–100)
Potassium: 3.8 mEq/L (ref 3.5–5.3)
Sodium: 132 mEq/L — ABNORMAL LOW (ref 135–145)

## 2023-05-13 LAB — MAGNESIUM: Magnesium: 1.6 mg/dL (ref 1.6–2.6)

## 2023-05-13 LAB — URINE SODIUM, RANDOM: Urine Sodium: 98 mEq/L

## 2023-05-13 LAB — PHOSPHORUS: Phosphorus: 3.4 mg/dL (ref 2.3–4.7)

## 2023-05-13 NOTE — Plan of Care (Signed)
Problem: Compromised Friction/Shear  Goal: Friction and Shear Interventions  Outcome: Progressing  Flowsheets (Taken 05/13/2023 2354)  Friction and Shear Interventions: Pad bony prominences, Off load heels, HOB 30 degrees or less unless contraindicated, Consider: TAP seated positioning, Heel foams     Problem: Pain interferes with ability to perform ADL  Goal: Pain at adequate level as identified by patient  Outcome: Progressing  Flowsheets (Taken 05/13/2023 2354)  Pain at adequate level as identified by patient:   Include patient/patient care companion in decisions related to pain management as needed   Consult/collaborate with Physical Therapy, Occupational Therapy, and/or Speech Therapy   Consult/collaborate with Pain Service   Offer non-pharmacological pain management interventions   Evaluate patient's satisfaction with pain management progress   Evaluate if patient comfort function goal is met   Reassess pain within 30-60 minutes of any procedure/intervention, per Pain Assessment, Intervention, Reassessment (AIR) Cycle   Assess pain on admission, during daily assessment and/or before any "as needed" intervention(s)   Assess for risk of opioid induced respiratory depression, including snoring/sleep apnea. Alert healthcare team of risk factors identified.   Identify patient comfort function goal     Problem: Neurological Deficit  Goal: Neurological status is stable or improving  Outcome: Progressing  Flowsheets (Taken 05/13/2023 2354)  Neurological status is stable or improving:   Perform CAM Assessment   Observe for seizure activity and initiate seizure precautions if indicated   Re-assess NIH Stroke Scale for any change in status   Monitor/assess NIH Stroke Scale   Monitor/assess/document neurological assessment (Stroke: every 4 hours)
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
## 2023-05-13 NOTE — OT Progress Note (Signed)
Occupational Therapy Treatment  Austin Reilly        Post Acute Care Therapy Recommendations:     Discharge Recommendations:  Acute Rehab    If Acute Rehab  recommended discharge disposition is not available, patient will need hands on assist for functional transfers/ADLs, first floor setup, and HH OT.     DME needs IF patient is discharging home: [facility] (mobility equipment per PT)    Therapy discharge recommendations may change with patient status.  Please refer to most recent note for up-to-date recommendations.    Patient anticipated to benefit from and to be able to engage in 3 hours of therapy a day for 5 days a week.     Assessment:   Significant Findings: none    Pt received supine in bed, agreeable to OT session given light encouragement. Pt c/o dizziness during functional transfers. BP monitored with mild drop in BP during static standing - RN aware and improvement in BP with increased activity. Pt requires min-maxA for UE/LE dressing. Pt conts to present below functional baseline during ADL participation. Rec cont OT in order to optimize functional performance.     Treatment Activities: BADL training, functional transfers, pt education, neuro re-ed     Educated the patient to role of occupational therapy, plan of care, goals of therapy and safety with mobility and ADLs.    Plan:    OT Frequency Recommended: 3-4x/wk     Continue plan of care.    Unit: [facility] TOWER 3  Bed: F329/F329.01       Precautions and Contraindications:   Falls   WBAT R UE  WBAT B LEs   Spanish speaking VRI [phone]    Updated Medical Status/Imaging/Labs:  Reviewed     Subjective: "I don't know if I could put those on"    Patient's medical condition is appropriate for Occupational Therapy intervention at this time.  Patient is agreeable to participation in the therapy session.    Pain:   Scale: 3/10  Location: head  Intervention: positioning, modification of session, RN aware     Objective:   Patient is supine  in bed with PIV access and chest tube in place.  Pt wore mask during therapy session:No      Cognition  Alert   Follows simple commands appropriately     Functional Mobility  Sup to sit: modA  Sit to Stand: CGA-minA d/t decreased safety   Transfers: minA with platform RW    PMP Activity: Step 6 - Walks in Room    Balance  Static Sitting: fair+  Dynamic Sitting: fair  Static Standing: poor+  Dynamic Standing: poor+ with platform RW    Self Care and Home Management  Grooming: minA seated   UE Dressing: minA  LE dressing: maxA     Therapeutic Exercises  During ADL participation     Participation: good  Endurance: fair    Patient left with call bell within reach, all needs met, SCDs off as found, fall mat in place, chair alarm on and all questions answered. RN notified of session outcome and patient response.     Goals:  Time For Goal Achievement: 5 visits  ADL Goals  Patient will groom self: Supervision  Patient will dress lower body: Minimal Assist  Patient will toilet: Minimal Assist  Mobility and Transfer Goals  Pt will perform functional transfers: Minimal Assist  Neuro Re-Ed Goals  Pt will perform dynamic standing balance: Minimal Assist, for 5 minutes,  to complete standing ADLs safely                        PPE worn during session: gloves    Tech present: no  PPE worn by tech: N/A    Mitchel Honour OTR/L, CBIS   Pager [phone]      Time of Treatment  OT Received On: 05/13/23  Start Time: 1400  Stop Time: 1430  Time Calculation (min): 30 min    Treatment # 2 of 5 visits

## 2023-05-13 NOTE — Plan of Care (Signed)
Problem: Moderate/High Fall Risk Score >5  Goal: Patient will remain free of falls  Outcome: Progressing  Flowsheets (Taken 05/13/2023 0820)  High (Greater than 13):   HIGH-Consider use of low bed   HIGH-Initiate use of floor mats as appropriate   HIGH-Apply yellow "Fall Risk" arm band   HIGH-Utilize chair pad alarm for patient while in the chair   HIGH-Bed alarm on at all times while patient in bed   HIGH-Visual cue at entrance to patient's room   MOD-Include family in multidisciplinary POC discussions   MOD-Place Fall Risk level on whiteboard in room     Problem: Pain interferes with ability to perform ADL  Goal: Pain at adequate level as identified by patient  Outcome: Progressing  Flowsheets (Taken 05/09/2023 2000 by Rise Patience, RN)  Pain at adequate level as identified by patient:   Identify patient comfort function goal   Assess for risk of opioid induced respiratory depression, including snoring/sleep apnea. Alert healthcare team of risk factors identified.   Assess pain on admission, during daily assessment and/or before any "as needed" intervention(s)   Reassess pain within 30-60 minutes of any procedure/intervention, per Pain Assessment, Intervention, Reassessment (AIR) Cycle   Evaluate if patient comfort function goal is met   Offer non-pharmacological pain management interventions   Consult/collaborate with Pain Service   Consult/collaborate with Physical Therapy, Occupational Therapy, and/or Speech Therapy   Include patient/patient care companion in decisions related to pain management as needed   Evaluate patient's satisfaction with pain management progress     Problem: Side Effects from Pain Analgesia  Goal: Patient will experience minimal side effects of analgesic therapy  Outcome: Progressing  Flowsheets (Taken 05/11/2023 1043)  Patient will experience minimal side effects of analgesic therapy:   Monitor/assess patient's respiratory status (RR depth, effort, breath sounds)   Prevent/manage  side effects per LIP orders (i.e. nausea, vomiting, pruritus, constipation, urinary retention, etc.)   Assess for changes in cognitive function   Evaluate for opioid-induced sedation with appropriate assessment tool (i.e. POSS)

## 2023-05-13 NOTE — PT Progress Note (Signed)
Physical Therapy Treatment  Austin Reilly  Post Acute Care Therapy Recommendations:   Discharge Recommendations:  Acute Rehab    If Acute Rehab  recommended discharge disposition is not available, patient will need x1 assist for mobility and ADLs/IADLs, transport, HHPT.     DME needs IF patient is discharging home: Front wheel walker (with R UE platform attachment)    Therapy discharge recommendations may change with patient status.  Please refer to most recent note for up-to-date recommendations.      Assessment:   Significant Findings: NA    Pt presented seated in bedside chair with OT present, agreeable to PT handoff. Pt performs functional mobility grossly with CGA-minA. Pt demonstrates improving gait pattern with normalizing step length/clearance. Pt tolerates therex well for improving LE ROM/strenght. Pt continues to be limited by c/o back pain and dizziness with mobility, vitals noted to be WNL. Pt will continue to benefit from acute PT.     Assessment: Decreased UE ROM, Decreased LE ROM, Decreased UE strength, Decreased LE strength, Decreased safety/judgement during functional mobility, Decreased endurance/activity tolerance, Decreased functional mobility, Decreased balance, Gait impairment  Progress: Progressing toward goals  Prognosis: Good, With continued PT status post acute discharge  Risks/Benefits/POC Discussed with Pt/Family: With patient  Patient left without needs and call bell within reach. RN notified of session outcome.     Treatment Activities: transfers, gait, therex.    Educated the patient to role of physical therapy, plan of care, goals of therapy and safety with mobility and ADLs, energy conservation techniques, weight bearing precautions, discharge instructions, home safety.    Plan:   Treatment/Interventions: Exercise, Gait training, Stair training, Neuromuscular re-education, Functional transfer training, LE strengthening/ROM, Endurance training, Patient/family training,  Equipment eval/education, Bed mobility        PT Frequency: 4-5x/wk     Continue plan of care.    Unit: [facility] TOWER 3  Bed: F329/F329.01     Precautions and Contraindications:   Precautions  Weight Bearing Status: no restrictions  Precaution Instructions Given to Patient: Yes  Other Precautions: Falls, spanish speaking needs interpreter      Subjective:   Patient Goal: To get stronger    Pain Assessment  Pain Assessment: Numeric Scale (0-10)  Pain Score: 4-moderate pain  Pain Location: Arm  Pain Orientation: Right  Pain Intervention(s): Medication (See eMAR);Repositioned;Ambulation/increased activity    Patient's medical condition is appropriate for Physical Therapy intervention at this time.  Patient is agreeable to participation in the therapy session. Nursing clears patient for therapy.    Objective:   Observation of Patient/Vital Signs:  Patient is seated in a bedside chair with  NA  in place.    Cognition/Neuro Status  Arousal/Alertness: Appropriate responses to stimuli  Attention Span: Appears intact  Orientation Level: Oriented X4  Memory: Decreased recall of biographical information;Decreased recall of recent events  Following Commands: Follows multistep commands with repetition  Safety Awareness: moderate verbal instruction  Insights: Decreased awareness of deficits;Educated in safety awareness  Problem Solving: supervision  Behavior: attentive;calm;cooperative  Motor Planning: intact  Coordination: intact            Functional Mobility:  Sit to Stand: Contact Guard Assist  Stand to Sit: Contact Guard Assist       Ambulation:  PMP - Progressive Mobility Protocol   PMP Activity: Step 7 - Walks out of Room  Distance Walked (ft) (Step 6,7): 75 Feet     Ambulation: Contact Guard Assist;Minimal Assist;with front-wheeled walker  Pattern: R decreased stance time;R foot decreased clearance;decreased cadence;decreased step length  Stair Management: not attempted    Therapeutic Exercise  Knee  AROM : 3x10 bil  Ankle Pumps: x30  Strengthening:  (STSx5)     Neuro Re-Ed  Sitting Balance: without support;supervision  Standing Balance: with support;contact guard assist (FWW)         Patient Participation: Good  Patient Endurance: Good    Patient left with call bell within reach, all needs met, SCDs Deferred (as found), fall mat in place , bed alarm n/a, chair alarm in place  and all questions answered. RN notified of session outcome and patient response.     Goals:  Goals  Goal Formulation: With patient  Time for Goal Acheivement: 7 visits  Goals: Select goal  Pt Will Go Supine To Sit: with contact guard assist, to maximize functional mobility and independence, 7 visits  Pt Will Perform Sit to Stand: with contact guard assist, to maximize functional mobility and independence, 7 visits  Pt Will Transfer Bed/Chair: with contact guard assist, to maximize functional mobility and independence, 7 visits  Pt Will Ambulate: 151-200 feet, with contact guard assist, to maximize functional mobility and independence, 7 visits  Pt Will Go Up / Down Stairs: 1 flight, with minimal assist, With rail, to maximize functional mobility and independence, 7 visits    PPE worn during session: procedural mask and gloves  Pt wore mask during therapy session:No    Tech present: NA  PPE worn by tech: N/A      Time of Treatment  PT Received On: 05/13/23  Start Time: 1145  Stop Time: 1215  Time Calculation (min): 30 min  Treatment # 3 out of 7 visits    Brayton El PT, DPT 05/13/2023 2:48 PM  Pager #[phone]
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
## 2023-05-14 ENCOUNTER — Inpatient Hospital Stay: Payer: Medicaid Other

## 2023-05-14 DIAGNOSIS — G8911 Acute pain due to trauma: Secondary | ICD-10-CM

## 2023-05-14 LAB — CBC
Absolute nRBC: 0 10*3/uL (ref <=0.00)
Hematocrit: 31.4 % — ABNORMAL LOW (ref 37.6–49.6)
Hemoglobin: 10.6 g/dL — ABNORMAL LOW (ref 12.5–17.1)
MCH: 31.9 pg (ref 25.1–33.5)
MCHC: 33.8 g/dL (ref 31.5–35.8)
MCV: 94.6 fL (ref 78.0–96.0)
MPV: 9.2 fL (ref 8.9–12.5)
Platelet Count: 871 10*3/uL — ABNORMAL HIGH (ref 142–346)
RBC: 3.32 10*6/uL — ABNORMAL LOW (ref 4.20–5.90)
RDW: 15 % (ref 11–15)
WBC: 13.3 10*3/uL — ABNORMAL HIGH (ref 3.10–9.50)
nRBC %: 0 /100 WBC (ref <=0.0)

## 2023-05-14 LAB — BASIC METABOLIC PANEL
Anion Gap: 5 (ref 5.0–15.0)
BUN: 13 mg/dL (ref 9–28)
CO2: 24 mEq/L (ref 17–29)
Calcium: 9.3 mg/dL (ref 8.5–10.5)
Chloride: 101 mEq/L (ref 99–111)
Creatinine: 0.6 mg/dL (ref 0.5–1.5)
GFR: >60 mL/min/{1.73_m2} (ref >=60.0)
Glucose: 104 mg/dL — ABNORMAL HIGH (ref 70–100)
Potassium: 4.3 mEq/L (ref 3.5–5.3)
Sodium: 130 mEq/L — ABNORMAL LOW (ref 135–145)

## 2023-05-14 NOTE — Plan of Care (Signed)
Problem: Compromised Activity/Mobility  Goal: Activity/Mobility Interventions  Flowsheets (Taken 05/14/2023 0800)  Activity/Mobility Interventions: Pad bony prominences, TAP Seated positioning system when OOB, Promote PMP, Reposition q 2 hrs / turn clock, Offload heels     Problem: Pain interferes with ability to perform ADL  Goal: Pain at adequate level as identified by patient  Flowsheets (Taken 05/13/2023 2354 by Beverely Pace, RN)  Pain at adequate level as identified by patient:   Include patient/patient care companion in decisions related to pain management as needed   Consult/collaborate with Physical Therapy, Occupational Therapy, and/or Speech Therapy   Consult/collaborate with Pain Service   Offer non-pharmacological pain management interventions   Evaluate patient's satisfaction with pain management progress   Evaluate if patient comfort function goal is met   Reassess pain within 30-60 minutes of any procedure/intervention, per Pain Assessment, Intervention, Reassessment (AIR) Cycle   Assess pain on admission, during daily assessment and/or before any "as needed" intervention(s)   Assess for risk of opioid induced respiratory depression, including snoring/sleep apnea. Alert healthcare team of risk factors identified.   Identify patient comfort function goal     Problem: Neurological Deficit  Goal: Neurological status is stable or improving  Flowsheets (Taken 05/13/2023 2354 by Beverely Pace, RN)  Neurological status is stable or improving:   Perform CAM Assessment   Observe for seizure activity and initiate seizure precautions if indicated   Re-assess NIH Stroke Scale for any change in status   Monitor/assess NIH Stroke Scale   Monitor/assess/document neurological assessment (Stroke: every 4 hours)
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
## 2023-05-14 NOTE — Progress Notes (Signed)
Pt up to chair. Vss. No neuro changes. Pain controlled with scheduled meds. Voiding without difficulty. Good appetite. Ambulates with one assist using platform walker. Chest tube removed this am per team. Repeat chest x ray at 1700. Family at bedside.

## 2023-05-14 NOTE — Progress Notes (Signed)
Right chest tube removed.  Occlusive dressing applied.  Patient tolerated procedure well.  Educated on reporting s/sx of respiratory compromise (SOB, chest pain) to bedside nurse.  Follow up CXR ordered for 1700.    Pincus Badder, FNP-C  Trauma Acute Care Surgery  [facility]

## 2023-05-15 ENCOUNTER — Inpatient Hospital Stay: Payer: Medicaid Other

## 2023-05-15 DIAGNOSIS — S37031A Laceration of right kidney, unspecified degree, initial encounter: Secondary | ICD-10-CM

## 2023-05-15 DIAGNOSIS — S066XAA Traumatic subarachnoid hemorrhage with loss of consciousness status unknown, initial encounter: Principal | ICD-10-CM

## 2023-05-15 DIAGNOSIS — S270XXA Traumatic pneumothorax, initial encounter: Secondary | ICD-10-CM

## 2023-05-15 LAB — BASIC METABOLIC PANEL
Anion Gap: 8 (ref 5.0–15.0)
BUN: 12 mg/dL (ref 9–28)
CO2: 24 mEq/L (ref 17–29)
Calcium: 9.3 mg/dL (ref 8.5–10.5)
Chloride: 100 mEq/L (ref 99–111)
Creatinine: 0.7 mg/dL (ref 0.5–1.5)
GFR: >60 mL/min/{1.73_m2} (ref >=60.0)
Glucose: 135 mg/dL — ABNORMAL HIGH (ref 70–100)
Potassium: 4.1 mEq/L (ref 3.5–5.3)
Sodium: 132 mEq/L — ABNORMAL LOW (ref 135–145)

## 2023-05-15 NOTE — Plan of Care (Addendum)
Problem: Pain interferes with ability to perform ADL  Goal: Pain at adequate level as identified by patient  Outcome: Progressing  Flowsheets (Taken 05/15/2023 2343)  Pain at adequate level as identified by patient:   Identify patient comfort function goal   Assess for risk of opioid induced respiratory depression, including snoring/sleep apnea. Alert healthcare team of risk factors identified.   Assess pain on admission, during daily assessment and/or before any "as needed" intervention(s)   Reassess pain within 30-60 minutes of any procedure/intervention, per Pain Assessment, Intervention, Reassessment (AIR) Cycle   Evaluate patient's satisfaction with pain management progress   Evaluate if patient comfort function goal is met   Offer non-pharmacological pain management interventions     Problem: Neurological Deficit  Goal: Neurological status is stable or improving  Outcome: Progressing  Flowsheets (Taken 05/15/2023 2343)  Neurological status is stable or improving:   Monitor/assess/document neurological assessment (Stroke: every 4 hours)   Observe for seizure activity and initiate seizure precautions if indicated   Perform CAM Assessment
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
## 2023-05-15 NOTE — Plan of Care (Signed)
Problem: Compromised Sensory Perception  Goal: Sensory Perception Interventions  Flowsheets (Taken 05/14/2023 2000)  Sensory Perception Interventions: Offload heels, Pad bony prominences, Reposition q 2hrs/turn Clock, Q2 hour skin assessment under devices if present     Problem: Compromised Activity/Mobility  Goal: Activity/Mobility Interventions  Flowsheets (Taken 05/14/2023 2000)  Activity/Mobility Interventions: Pad bony prominences, TAP Seated positioning system when OOB, Promote PMP, Reposition q 2 hrs / turn clock, Offload heels     Problem: Compromised Friction/Shear  Goal: Friction and Shear Interventions  Flowsheets (Taken 05/14/2023 2000)  Friction and Shear Interventions: Pad bony prominences, Off load heels, HOB 30 degrees or less unless contraindicated, Consider: TAP seated positioning, Heel foams     Problem: Potential for Aspiration  Goal: Risk of aspiration will be minimized  Flowsheets (Taken 05/15/2023 0458)  Risk of aspiration will be minimized:   Assess/monitor ability to swallow using dysphagia screen: Keep patient NPO if patient fails screening   Place swallow precaution signage above bed and supervise patient during oral intake   Monitor/assess for signs of aspiration (tachypnea, cough, wheezing, clearing throat, hoarseness after eating, decrease in SaO2)   Place patient up in chair to eat, if possible/head of bed up 90 degrees to eat if unable to be out of bed

## 2023-05-15 NOTE — Progress Notes (Signed)
Systems Case Management Progress Note:   Type of Services Provider Name   Provider Phone Number   Length of Need SCM approved by:  Comments:   Skilled Nursing Facility "SNF"        Assisted Living        LTAC        Dialysis        Home Health        Infusion        DME Era Skeen, [place] [phone] purchase [employer] FWW with RUE platform attachment   Medications        Transportation        Non-skilled Care ie Private Duty Aide          Financial form completed: Yes   No     Financial assessment completed: Yes   No     Larita Fife, MSW  Social Worker Case Manager  Care Management Department  [facility]

## 2023-05-15 NOTE — OT Progress Note (Signed)
Occupational Therapy Treatment  Corinna Gab        Post Acute Care Therapy Recommendations:     Discharge Recommendations:  Home with supervision, Home with home health OT    D/C Milestones: Increased independence during standing ADLs in 1-2 sessions.     DME needs IF patient is discharging home: BSC (mobility equipiment per PT)    Therapy discharge recommendations may change with patient status.  Please refer to most recent note for up-to-date recommendations.  Assessment:   Significant Findings: none    Pt received seated in bedside chair, agreeable to OT session. Pt is progressing towards functional OT goals. Updated pt's d/c rec based on functional progress. Pt conts to present below functional baseline during ADL participation. Rec cont OT in order to optimize functional performance.     Treatment Activities: BADL training, functional transfers, pt education, neuro re-ed     Educated the patient to role of occupational therapy, plan of care, goals of therapy and safety with mobility and ADLs.    Plan:    OT Frequency Recommended: 3-4x/wk     Continue plan of care.    Unit: [facility] TOWER 3  Bed: F329/F329.01       Precautions and Contraindications:   Falls   WBAT R UE  WBAT B LEs   Spanish speaking    Updated Medical Status/Imaging/Labs:  Reviewed     Subjective: "I will sit in the chair"    Patient's medical condition is appropriate for Occupational Therapy intervention at this time.  Patient is agreeable to participation in the therapy session.    Pain:   Scale: 1/10  Location: head  Intervention: positioning, modification of session, RN aware     Objective:   Patient is seated in bedside chair with PIV access in place.  Pt wore mask during therapy session:No      Cognition  Alert   Follows simple commands appropriately     Functional Mobility  Sit to Stand: SBA  Transfers: SBA with platform RW    PMP Activity: Step 6 - Walks in Room    Balance  Static Sitting: good  Dynamic  Sitting: fair+  Static Standing: fair  Dynamic Standing: fair with platform RW    Self Care and Home Management  UE Dressing: SBA  LE dressing: minA    Therapeutic Exercises  During ADL participation     Participation: good  Endurance: fair+    Patient left with call bell within reach, all needs met, SCDs off as found, fall mat in place, chair alarm on and all questions answered. RN notified of session outcome and patient response.     Goals:  Time For Goal Achievement: 5 visits  ADL Goals  Patient will groom self: Supervision  Patient will dress lower body: Minimal Assist  Patient will toilet: Minimal Assist  Mobility and Transfer Goals  Pt will perform functional transfers: Minimal Assist  Neuro Re-Ed Goals  Pt will perform dynamic standing balance: Minimal Assist, for 5 minutes, to complete standing ADLs safely                        PPE worn during session: gloves    Tech present: no  PPE worn by tech: N/A    Mitchel Honour OTR/L, CBIS   Pager [phone]      Time of Treatment  OT Received On: 05/15/23  Start Time: 1410  Stop Time: 1440  Time  Calculation (min): 30 min    Treatment # 3 of 5 visits

## 2023-05-15 NOTE — PT Progress Note (Addendum)
Physical Therapy Treatment  Austin Reilly  Post Acute Care Therapy Recommendations:     Discharge Recommendations:  Home with supervision, Home with home health PT    D/C Milestones:  Anticipate achievement in 2-3 sessions to complete stairs training and gait without AD    DME needs IF patient is discharging home: Front wheel walker (RUE platform attachment)    Therapy discharge recommendations may change with patient status.  Please refer to most recent note for up-to-date recommendations.      Assessment:   Significant Findings: none    Patient found supine in bed at initiation of session, amenable to PT treatment today. Patient continues to make progress with functional mobility. Patient grossly SBA for bed mobility, transfers, and gait with FWW today. Ambulates 300 feet with single standing rest break. Based on progress today, rec changed to home with supervision. Will trial ambulation without AD and stairs at later session to ensure safe entry/exit from home prior to discharge. Continues to report pain in back of head and neck but does not limit mobility, vitals stable. Patient is currently functioning below baseline and would benefit from continued acute PT to optimize functional mobility upon discharge.        Assessment: Decreased UE ROM, Decreased LE ROM, Decreased UE strength, Decreased LE strength, Decreased safety/judgement during functional mobility, Decreased endurance/activity tolerance, Decreased functional mobility, Decreased balance, Gait impairment  Progress: Progressing toward goals  Prognosis: Good, With continued PT status post acute discharge  Risks/Benefits/POC Discussed with Pt/Family: With patient  Patient left without needs and call bell within reach. RN notified of session outcome.     Treatment Activities: bed mobility, gait training, functional transfer training, LE strengthening/ROM, therapeutic exercise    Vitals: stable    Educated the patient to role of physical therapy, plan  of care, goals of therapy and safety with mobility and ADLs, home safety.    Plan:   Treatment/Interventions: Exercise, Gait training, Stair training, Neuromuscular re-education, Functional transfer training, LE strengthening/ROM, Endurance training, Patient/family training, Equipment eval/education, Bed mobility        PT Frequency: 4-5x/wk     Continue plan of care.    Unit: [facility] TOWER 3  Bed: F329/F329.01     Precautions and Contraindications:   Precautions  Weight Bearing Status: no restrictions  Other Precautions: Falls, spanish speaking needs interpreter Garment/textile technologist # [phone])    Updated Medical Status/Imaging/Labs:     Lab Results   Component Value Date/Time    HGB 10.6 (L) 05/14/2023 04:13 AM    HCT 31.4 (L) 05/14/2023 04:13 AM    K 4.1 05/15/2023 03:27 AM    NA 132 (L) 05/15/2023 03:27 AM    INR 1.1 05/01/2023 10:04 PM    TROPI 26.0 05/02/2023 06:41 AM       XR Chest AP Portable    Result Date: 05/15/2023  No change in small right hydropneumothorax. Shelly Flatten, MD 05/15/2023 8:06 AM    XR Chest AP Portable    Result Date: 05/14/2023  Stable right apical pneumothorax. Small bilateral pleural effusions are stable. Pankaj Dominica, MD 05/14/2023 6:03 PM       Subjective: "If I do well enough can I go home?"   Patient Goal: To get stronger, go home    Pain Assessment  Pain Assessment: Numeric Scale (0-10)  Pain Score:  (No number given)  Pain Location: Head  Pain Orientation: Posterior  Pain Intervention(s): Repositioned     Patient's medical condition is  appropriate for Physical Therapy intervention at this time.  Patient is agreeable to participation in the therapy session. Nursing clears patient for therapy.    Objective:   Observation of Patient/Vital Signs:  Patient is in bed with dressings and peripheral IV in place.    Cognition/Neuro Status  Arousal/Alertness: Appropriate responses to stimuli  Attention Span: Appears intact  Orientation Level: Oriented X4  Memory: Decreased recall  of biographical information;Decreased recall of recent events  Following Commands: Follows multistep commands with repetition  Safety Awareness: moderate verbal instruction  Insights: Decreased awareness of deficits;Educated in safety awareness  Problem Solving: supervision  Behavior: attentive;calm;cooperative  Motor Planning: intact  Coordination: intact            Functional Mobility:  Supine to Sit: Supervision  Scooting to EOB: Supervision  Sit to Stand: Stand by Assist (with FWW and platform on R)  Stand to Sit: Stand by Assist  Transfers  Bed to Chair: Stand by Assist (with FWW and platform on R)    Ambulation:  PMP - Progressive Mobility Protocol   PMP Activity: Step 7 - Walks out of Room  Distance Walked (ft) (Step 6,7): 300 Feet (woith platform walker)     Ambulation: with platform walker;Stand by Assist (x 300 feet)  Pattern: R decreased stance time;R foot decreased clearance;decreased cadence;decreased step length    Therapeutic Exercise  Strengthening: other (comment) (LE AROM prior to mobility)               Patient Participation: Good  Patient Endurance: Fair+    Patient left sitting in bedside chair with all needs addressed. Patient left with call bell within reach, all needs met, SCDs off as found, fall mat in place, bed alarm n/a, chair alarm on and all questions answered. RN notified of session outcome and patient response.     Goals:  Goals  Goal Formulation: With patient  Time for Goal Acheivement: 7 visits  Goals: Select goal  Pt Will Go Supine To Sit: with contact guard assist, to maximize functional mobility and independence, 7 visits, Goal met  Pt Will Perform Sit to Stand: with contact guard assist, to maximize functional mobility and independence, 7 visits, Goal met  Pt Will Transfer Bed/Chair: with contact guard assist, to maximize functional mobility and independence, 7 visits, Goal met  Pt Will Ambulate: 151-200 feet, with contact guard assist, to maximize functional mobility and  independence, 7 visits, Goal met  Pt Will Go Up / Down Stairs: 1 flight, with minimal assist, With rail, to maximize functional mobility and independence, 7 visits    PPE worn during session: gloves  Pt wore mask during therapy session:No    Tech present: no  PPE worn by tech: N/A      Amie Critchley PT, DPT [phone]    Time of Treatment  PT Received On: 05/15/23  Start Time: 1015  Stop Time: 1051  Time Calculation (min): 36 min    Treatment # PT Visit Number: 4 out of 7 visits

## 2023-05-15 NOTE — Progress Notes (Signed)
Reviewed pt's chart. Chest tube removed. Close to medical clearance.     SW met pt at bedside to discuss d/c planning and charity paperwork for [facility]. Pt reports to feeling much better and has walked around the unit, would like to explore going home instead of ARU. SW will check updated notes and f/u with pt. Pt completed charity application and provided to SW. SW will f/u with pt on d/c plan.     Reviewed PT notes, pt walked 300 feet, rec for home with HH PT OT, and FWW with RUE platform attachment. New dispo: home, will SCM FWW with RUE platform. Placed order for [facility] for SCM FWW with RUE platform attachment. Will provide pt update at bedside.    Larita Fife, MSW  Social Worker Case Manager  Care Management Department  [facility]

## 2023-05-15 NOTE — Progress Notes (Addendum)
Orthostatic vitals    Pt c/o dizziness upon standing from chair and walking to bed. MD notified.        05/15/23 2045 05/15/23 2047 05/15/23 2049   Vital Signs   BP 122/69 123/73 124/78   MAP (mmHg) 90 92 96   Patient Position Lying Sitting Standing

## 2023-05-15 NOTE — Plan of Care (Signed)
Problem: Compromised Sensory Perception  Goal: Sensory Perception Interventions  Outcome: Progressing     Problem: Compromised Activity/Mobility  Goal: Activity/Mobility Interventions  Outcome: Progressing     Problem: Pain interferes with ability to perform ADL  Goal: Pain at adequate level as identified by patient  Outcome: Progressing  Flowsheets (Taken 05/15/2023 1841)  Pain at adequate level as identified by patient:   Identify patient comfort function goal   Assess for risk of opioid induced respiratory depression, including snoring/sleep apnea. Alert healthcare team of risk factors identified.   Evaluate if patient comfort function goal is met   Assess pain on admission, during daily assessment and/or before any "as needed" intervention(s)   Offer non-pharmacological pain management interventions   Consult/collaborate with Physical Therapy, Occupational Therapy, and/or Speech Therapy   Include patient/patient care companion in decisions related to pain management as needed
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
## 2023-05-16 ENCOUNTER — Encounter (INDEPENDENT_AMBULATORY_CARE_PROVIDER_SITE_OTHER): Payer: Self-pay

## 2023-05-16 ENCOUNTER — Other Ambulatory Visit: Payer: Self-pay | Admitting: Family

## 2023-05-16 LAB — SODIUM: Sodium: 135 mEq/L (ref 135–145)

## 2023-05-16 MED ORDER — METHOCARBAMOL 1000 MG PO TABS
1000.0000 mg | ORAL_TABLET | Freq: Four times a day (QID) | ORAL | 0 refills | Status: DC
Start: 2023-05-16 — End: 2023-05-16

## 2023-05-16 MED ORDER — LIDOCAINE 5 % EX PTCH
2.0000 | MEDICATED_PATCH | CUTANEOUS | 0 refills | Status: DC
Start: 2023-05-16 — End: 2023-05-16

## 2023-05-16 MED ORDER — SODIUM CHLORIDE 1 G PO TABS
3.0000 g | ORAL_TABLET | Freq: Three times a day (TID) | ORAL | Status: DC
Start: 2023-05-16 — End: 2023-05-16
  Administered 2023-05-16 (×2): 3 g via NASOGASTRIC
  Filled 2023-05-16 (×2): qty 3

## 2023-05-16 MED ORDER — MELATONIN 3 MG PO TABS
6.0000 mg | ORAL_TABLET | Freq: Every evening | ORAL | Status: AC | PRN
Start: 2023-05-16 — End: ?

## 2023-05-16 MED ORDER — GABAPENTIN 400 MG PO CAPS
800.0000 mg | ORAL_CAPSULE | Freq: Three times a day (TID) | ORAL | 0 refills | Status: AC
Start: 2023-05-16 — End: 2023-05-23

## 2023-05-16 MED ORDER — SENNOSIDES-DOCUSATE SODIUM 8.6-50 MG PO TABS
2.0000 | ORAL_TABLET | Freq: Two times a day (BID) | ORAL | Status: AC
Start: 2023-05-16 — End: ?

## 2023-05-16 MED ORDER — OXYCODONE HCL 5 MG PO TABS
5.0000 mg | ORAL_TABLET | Freq: Four times a day (QID) | ORAL | 0 refills | Status: AC | PRN
Start: 2023-05-16 — End: 2023-05-23

## 2023-05-16 MED ORDER — TAB-A-VITE/BETA CAROTENE PO TABS
1.0000 | ORAL_TABLET | Freq: Every day | ORAL | Status: AC
Start: 2023-05-17 — End: ?

## 2023-05-16 MED ORDER — SODIUM CHLORIDE 1 G PO TABS
3.0000 g | ORAL_TABLET | Freq: Three times a day (TID) | ORAL | 0 refills | Status: AC
Start: 2023-05-16 — End: 2023-06-15

## 2023-05-16 MED ORDER — FOLIC ACID 1 MG PO TABS
1.0000 mg | ORAL_TABLET | Freq: Every day | ORAL | Status: AC
Start: 2023-05-17 — End: ?

## 2023-05-16 MED ORDER — THIAMINE HCL 100 MG PO TABS
100.0000 mg | ORAL_TABLET | Freq: Every day | ORAL | Status: AC
Start: 2023-05-17 — End: ?

## 2023-05-16 MED ORDER — ACETAMINOPHEN 500 MG PO TABS
1000.0000 mg | ORAL_TABLET | Freq: Three times a day (TID) | ORAL | Status: AC
Start: 2023-05-16 — End: ?

## 2023-05-16 MED ORDER — METHOCARBAMOL 500 MG PO TABS
1000.0000 mg | ORAL_TABLET | Freq: Four times a day (QID) | ORAL | 0 refills | Status: AC
Start: 2023-05-16 — End: 2023-05-23

## 2023-05-16 MED ORDER — POLYETHYLENE GLYCOL 3350 17 G PO PACK
17.0000 g | PACK | Freq: Every day | ORAL | Status: AC
Start: 2023-05-17 — End: ?

## 2023-05-16 NOTE — OT Progress Note (Signed)
Occupational Therapy Treatment  Corinna Gab        Post Acute Care Therapy Recommendations:     Discharge Recommendations:  Home with supervision, Home with home health OT    DME needs IF patient is discharging home: [facility] (mobility equipment per PT)    Therapy discharge recommendations may change with patient status.  Please refer to most recent note for up-to-date recommendations.  Assessment:   Significant Findings: none    Pt received seated in bedside chair, agreeable to OT session. Per medical chart, pt is declining FWW at d/c - re-educated pt on importance for utilizing assistive device for safety while navigating to/from bathroom in preparation for ADLs. Pt conts to present below functional baseline during ADL participation. Rec cont OT in order to optimize functional performance.     Treatment Activities: BADL training, functional transfers, pt education, neuro re-ed     Educated the patient to role of occupational therapy, plan of care, goals of therapy and safety with mobility and ADLs.    Plan:    OT Frequency Recommended: 3-4x/wk     Continue plan of care.    Unit: [facility] TOWER 3  Bed: F329/F329.01       Precautions and Contraindications:   Falls   WBAT R UE  WBAT B LEs   Spanish speaking    Updated Medical Status/Imaging/Labs:  Reviewed     Subjective: "do you know where I can get a piece of paper?"    Patient's medical condition is appropriate for Occupational Therapy intervention at this time.  Patient is agreeable to participation in the therapy session.    Pain:   Scale: 1/10  Location: head  Intervention: positioning, modification of session, RN aware     Objective:   Patient is seated in bedside chair with PIV access in place.  Pt wore mask during therapy session:No      Cognition  Alert   Follows simple commands appropriately     Functional Mobility  Sit to Stand: SBA  Transfers: SBA with platform RW; CGA-minA w/o assistive device     PMP Activity: Step 6 - Walks in  Room    Balance  Static Sitting: good  Dynamic Sitting: fair+  Static Standing: fair  Dynamic Standing: fair with platform RW    Self Care and Home Management  Grooming: SBA standing   Toileting: CGA    Therapeutic Exercises  During ADL participation     Participation: good  Endurance: fair+    Patient left with call bell within reach, all needs met, SCDs off as found, fall mat in place, chair alarm on and all questions answered. RN notified of session outcome and patient response.     Goals:  Time For Goal Achievement: 5 visits  ADL Goals  Patient will groom self: Supervision  Patient will dress lower body: Minimal Assist  Patient will toilet: Minimal Assist  Mobility and Transfer Goals  Pt will perform functional transfers: Minimal Assist  Neuro Re-Ed Goals  Pt will perform dynamic standing balance: Minimal Assist, for 5 minutes, to complete standing ADLs safely                        PPE worn during session: gloves    Tech present: no  PPE worn by tech: N/A    Mitchel Honour OTR/L, CBIS   Pager [phone]      Time of Treatment  OT Received On: 05/16/23  Start Time: 1230  Stop Time: 1255  Time Calculation (min): 25 min    Treatment # 4 of 5 visits
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
## 2023-05-16 NOTE — Nursing Progress Note (Signed)
TACS Discharge Checklist    Austin Reilly MRN: [id number]  Admission Date: 05/01/2023  Discharge Date: 05/16/2023    Dual signed AVS reviewed, discharge and medication teaching completed: yes    AVS Reviewed with:    All questions answered: yes    Care plan goals completed: yes    Education resolved: yes    IV out, tele monitor removed and [facility] notified([id number]): yes    Home medications delivered back to patient: NA    New prescriptions filled or pick up location verified: No, see below    Family Notified: Family at bedside    Report called to receiving facility: NA    Safely transported to: Wheeled to door #1 by staff. In care of son    Mode of transportation: Car    Patient left with all belongings, medications, and discharge paperwork: Yes    Belongings detail: book bag and clothes     Was the patient discharged greater than 2 hours from when D/C order was placed? yes    If yes, why?: an issue with medication payment    Discharge Time: left unit at 21:05      Patient's son intended on paying for discharge medications on arrival, but out patient pharmacy is unable to process transactions beyond 20:00. Patient was given the option to say for another day and discharge tomorrow, but he and the family decided on discharging tonight as planned. Austin Reilly was given the AVS in english and spanish and the number for outpatient pharmacy was highlighted. Austin Reilly son will call in the morning to determine if they will pick up the medications from [facility] or their local pharmacy ([facility], [address] [place], [place], [place] [address]

## 2023-05-16 NOTE — PT Progress Note (Signed)
Physical Therapy Treatment  Corinna Gab  Post Acute Care Therapy Recommendations:     Discharge Recommendations:  Home with supervision, Home with home health PT    DME needs IF patient is discharging home: Front wheel walker (RUE platform attachment), patient requesting wheelchair for community distance     Therapy discharge recommendations may change with patient status.  Please refer to most recent note for up-to-date recommendations.      Assessment:   Significant Findings: none    Patient found supine in bed at initiation of session, amenable to PT treatment today. Patient continues to progress with functional mobility. Patient still supervision for supine<>sit transfers, SBA for sit<>stand and gait today with FWW. Completed stair training with CGA with step to pattern and single rail, instructed on compensatory techniques. Discussed use of less restrictive gait aid but patient would like to continue using walker for now. No complaints of dizziness or lightheadedness today. Patient is currently functioning below baseline and would benefit from continued acute PT to optimize functional mobility upon discharge.       Assessment: Decreased UE ROM, Decreased LE ROM, Decreased UE strength, Decreased LE strength, Decreased safety/judgement during functional mobility, Decreased endurance/activity tolerance, Decreased functional mobility, Decreased balance, Gait impairment  Progress: Progressing toward goals  Prognosis: Good, With continued PT status post acute discharge  Risks/Benefits/POC Discussed with Pt/Family: With patient  Patient left without needs and call bell within reach. RN notified of session outcome.     Treatment Activities: bed mobility, gait training, functional transfer training, LE strengthening/ROM, therapeutic exercise    Vitals: stable    Educated the patient to role of physical therapy, plan of care, goals of therapy and safety with mobility and ADLs, home safety.    Plan:    Treatment/Interventions: Exercise, Gait training, Stair training, Neuromuscular re-education, Functional transfer training, LE strengthening/ROM, Endurance training, Patient/family training, Equipment eval/education, Bed mobility        PT Frequency: 4-5x/wk     Continue plan of care.    Unit: [facility] TOWER 3  Bed: F329/F329.01     Precautions and Contraindications:   Precautions  Weight Bearing Status: no restrictions  Other Precautions: Falls, spanish speaking needs interpreter    Updated Medical Status/Imaging/Labs:     Lab Results   Component Value Date/Time    HGB 10.6 (L) 05/14/2023 04:13 AM    HCT 31.4 (L) 05/14/2023 04:13 AM    K 4.1 05/15/2023 03:27 AM    NA 132 (L) 05/15/2023 03:27 AM    INR 1.1 05/01/2023 10:04 PM    TROPI 26.0 05/02/2023 06:41 AM       No results found.      Subjective: "I'll stick with the walker"   Patient Goal: To get stronger, go home    Pain Assessment  Pain Assessment: Numeric Scale (0-10)  Pain Score:  (Pain in right arm and left hip, but does not limit mobility)     Patient's medical condition is appropriate for Physical Therapy intervention at this time.  Patient is agreeable to participation in the therapy session. Nursing clears patient for therapy.    Objective:   Observation of Patient/Vital Signs:  Patient is seated in a bedside chair with peripheral IV in place.    Cognition/Neuro Status  Arousal/Alertness: Appropriate responses to stimuli  Attention Span: Appears intact  Orientation Level: Oriented X4  Memory: Decreased recall of biographical information;Decreased recall of recent events  Following Commands: Follows multistep commands with repetition  Safety Awareness: moderate verbal instruction  Insights: Decreased awareness of deficits;Educated in safety awareness  Problem Solving: supervision  Behavior: calm;cooperative            Functional Mobility:  Supine to Sit: Supervision  Sit to Supine: Supervision  Sit to Stand: Stand by Assist (with  platform walker)  Stand to Sit: Stand by Assist  Transfers  Bed to Chair: Supervision (with platform walker)    Ambulation:  PMP - Progressive Mobility Protocol   PMP Activity: Step 7 - Walks out of Room  Distance Walked (ft) (Step 6,7): 350 Feet (platform walker)     Ambulation: Stand by Assist;with platform walker (x 337ft)  Pattern: L decreased stance time;decreased cadence;decreased step length;Step through  Stair Management: Contact Guard Assist;one rail L;step to pattern  Number of Stairs: 10                    Patient Participation: Good  Patient Endurance: Good    Patient left sitting in bedside chair with all needs addressed. Patient left with call bell within reach, all needs met, SCDs off as found, fall mat in place, bed alarm n/a, chair alarm on and all questions answered. RN notified of session outcome and patient response.     Goals:  Goals  Goal Formulation: With patient  Time for Goal Acheivement: 7 visits  Goals: Select goal  Pt Will Go Supine To Sit: with contact guard assist, to maximize functional mobility and independence, 7 visits, Goal met  Pt Will Perform Sit to Stand: with contact guard assist, to maximize functional mobility and independence, 7 visits, Goal met  Pt Will Transfer Bed/Chair: with contact guard assist, to maximize functional mobility and independence, 7 visits, Goal met  Pt Will Ambulate: 151-200 feet, with contact guard assist, to maximize functional mobility and independence, 7 visits, Goal met  Pt Will Go Up / Down Stairs: 1 flight, with minimal assist, With rail, to maximize functional mobility and independence, 7 visits, Goal met    PPE worn during session: gloves  Pt wore mask during therapy session:No    Tech present: no  PPE worn by tech: N/A      Amie Critchley PT, DPT [phone]    Time of Treatment  PT Received On: 05/16/23  Start Time: 1010  Stop Time: 1045  Time Calculation (min): 35 min    Treatment # PT Visit Number: 5 out of 7 visits

## 2023-05-16 NOTE — Progress Notes (Signed)
[facility] for [employer] (previously [facility]):     Received a referral to schedule a follow up appointment with the [facility] for [employer].  Appointment scheduled for 05/31/23 at 3:00pm with NP Yoojin at [place] location.      Clinic address is as follows:    [address]. [place], [address]    Please notify patient to arrive 15 minutes early to the appointment and bring the following materials with them:    Insurance card (if insured) and photo ID  Medications in their original bottles  Glucometer/blood sugar log (if diabetic)  Weight log (if heart failure)  Proof of income (to enroll in medication assistance programs-first two pages of signed 1040 tax forms or last 2 months of pay stubs)      Terressa Koyanagi  [facility] for  [facility] Reg Rep III  T [phone]  F [phone]
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
## 2023-05-16 NOTE — SLP Progress Note (Signed)
[facility]   Speech Language Pathology  Treatment Note    Patient: Austin Reilly    MRN#: [id number]  Room: F329/F329.01    Treatment Type: Dysphagia     Recommendations/Plan:   Recommendations:  Solids:  Regular Solids (RG7)  Liquids: Thin Liquids (IDDSI TN0) via, any modality  Meds: PO, whole, with liquid     Precautions:   Precautions/Compensations: upright positioning, small single bites/sips, head of bed (HOB) >30 degrees after meals for 30-45 minutes, external pacing, eat and drink slowly  Supervision: 1:1 supervision    Plan:   Referrals: none  SLP Frequency Recommended: 2-3x/wk  Discharge recommendations: Ongoing SLP at next LOC, Defer to PT/OT recommendation    Assessment:   Patient seen for swallow follow up.     Patient's oral phase is characterized by adequate oral acceptance, intermittently reduced labial seal, mildly prolonged but overall effective mastication of regular solids, and no oral residue present post swallow. Suspect adequate bolus control and adequate a-p transfer. Patient's pharyngeal phase is characterized by present hyolaryngeal elevation, 1-2 swallows per bolus, and suspect intermittently incoordinated swallow initiation. Patient demonstrated delayed throat clearing with rapid consecutive sips of thin liquids; however, when provided with cues to slow rate no overt s/sx suggestive of aspiration noted. No overt s/sx suggestive of aspiration with puree or regular solids.     Recommend a regular texture diet with thin liquids. Recommend medications whole with thin liquids. Recommend standard aspiration precautions.     Discussed results/recommendations with direct RN following session.    SLP to continue to follow while inpatient. Could consider outpatient SLP follow up.     Subjective:   Patient is agreeable to participation in the therapy session. Nursing clears patient for therapy.. Patient is alert and agreeable to session. No family present at bedside.   PAIN: yes,  Pain rating: 5/10, Intervention: RN notified    Behavior/cognition:  awake, alert, calm , pleasant, and cooperative    Objective:   Patient Status:    - Current diet: Soft and Bite Sized solids(SB6) and Thin liquids (TN0)   - Position: seated in a bedside chair   - Medical equipment in place: IV   - Respiratory Status: room air    - Interpreter services:  Spanish utilized by Conservation officer, nature during session   - Precautions: fall    - Food allergies: none    Dysphagia:   Oral Inspection:   Lips: moist/pink  Tongue: moist/pink  Saliva: WFL  Teeth: WFL  Oral care provided: no  Comments: none    PO Trials Presented:   Thin (TN0) via cup and via straw   Puree (PU4)  Regular Solids (RG7)    Oral Phase:  reduced extraction of bolus from spoon      Pharyngeal Phase/Airway Protection:  present hyolaryngeal movement   1-3 swallows per bolus  delayed throat clearing with rapid consecutive sips of thin liquids      Patient verbally educated on results/recommendations.     Patient left with call bell within reach, all needs met, fall mat in place and all questions answered. RN notified of session outcome and patient response.    Goals:    Patient will participate in ongoing therapeutic diagnostic PO trials to assess candidacy for oral diet vs determine need/benefit for instrumental assessment x1 session. MET  Pt will complete instrumental swallow assessment to further assess swallow physiology, aspiration risk , and determine safest least restrictive PO diet x1 session. MET  Patient will  tolerate a soft and bite sized diet/thin liquids without overt s/s of aspiration or acute changes in respiratory status x24-48 hours. ONGOING  Patient will implement small, single sips and bites with min cueing in 5/5 opportunities. ONGOING  Patient will tolerate a regular texture diet/thin liquids without overt s/s of aspiration or acute changes in respiratory status x24-48 hours. ONGOING      Jamse Mead, MS CCC-SLP    PPE Worn by Provider:  gloves and face mask     Time of Treatment:  SLP Received On: 05/16/23  Start Time: 1655  Stop Time: 1710  Time Calculation (min): 15 min

## 2023-05-16 NOTE — Progress Notes (Signed)
EDSW received call from [facility] Luther Parody who was requesting assistance w/ paying for pt medications.     SW did chart review on pt. Appeared that pt was rec'd for [facility] PT/OT, however d/c order put in at 5pm after [facility] left for day. No FTF order for PT/OT or PACC order placed.     SW informed Charge RN Luther Parody, asking if pt could wait until tomorrow for d/c. Charge RN called SW back and reported that pt is insistent on d/c tonight, his son is on the way to pick him up, and that he is willing to pay for his own medications.     SW placed [facility] order and requested Charge RN to update provider to place FTF order for PT + OT. SW updated SWCM [facility] through secure chat to follow up on [facility] PT/OT tomorrow.     Jimmy Footman, LMSW, Supervisee in Social Work  ED Social Work Case Production designer, theatre/television/film I  [employer]  T: [phone])

## 2023-05-16 NOTE — Progress Notes (Signed)
Reviewed pt's chart. Dispo home with SCM FWW with RUE platform attachment and BSC. PACC notified of DME order.     SW met pt at bedside to review d/c plan. Informed pt of due to significant improvement, no longer rec for ARU, will plan to d/c to home. Pt agreeable. Plans to still d/c to sister's home in [place]. Informed pt of SCM FWW and BSC. Pt reports that sister already has a walker at home, stated walker does not have wheels. SW explained difference between standard walker and FWW with RUE platform attachment. Pt refused FWW and BSC. Pt requesting w/c. SW explained to needing tor review notes, if not rec for a w/c, unable to order. Pt verbalized understanding.     Not rec for w/c, walked 300 ft with FWW. No SCM for w/c. Will update pt at bedside.     Larita Fife, MSW  Social Worker Case Manager  Care Management Department  [facility]

## 2023-05-16 NOTE — Plan of Care (Signed)
Problem: Moderate/High Fall Risk Score >5  Goal: Patient will remain free of falls  Outcome: Progressing  Flowsheets (Taken 05/16/2023 0820)  Moderate Risk (6-13):   MOD-Consider activation of bed alarm if appropriate   MOD-Apply bed exit alarm if patient is confused   MOD-Floor mat at bedside (where available) if appropriate   MOD-Remain with patient during toileting   MOD-Consider a move closer to Nurses Station   MOD-Place bedside commode and assistive devices out of sight when not in use   MOD-Re-orient confused patients   MOD-Perform dangle, stand, walk (DSW) prior to mobilization   MOD-Utilize diversion activities   MOD-Request PT/OT consult order for patients with gait/mobility impairment   MOD-include family in multidisciplinary POC discussions   MOD-Use gait belt when appropriate   MOD- Consider video monitoring     Problem: Pain interferes with ability to perform ADL  Goal: Pain at adequate level as identified by patient  Outcome: Progressing  Flowsheets (Taken 05/15/2023 2343 by Adrian Prows, RN)  Pain at adequate level as identified by patient:   Identify patient comfort function goal   Assess for risk of opioid induced respiratory depression, including snoring/sleep apnea. Alert healthcare team of risk factors identified.   Assess pain on admission, during daily assessment and/or before any "as needed" intervention(s)   Reassess pain within 30-60 minutes of any procedure/intervention, per Pain Assessment, Intervention, Reassessment (AIR) Cycle   Evaluate patient's satisfaction with pain management progress   Evaluate if patient comfort function goal is met   Offer non-pharmacological pain management interventions     Problem: Side Effects from Pain Analgesia  Goal: Patient will experience minimal side effects of analgesic therapy  Outcome: Progressing  Flowsheets (Taken 05/11/2023 1043 by Dayton Martes, RN)  Patient will experience minimal side effects of analgesic therapy:   Monitor/assess  patient's respiratory status (RR depth, effort, breath sounds)   Prevent/manage side effects per LIP orders (i.e. nausea, vomiting, pruritus, constipation, urinary retention, etc.)   Assess for changes in cognitive function   Evaluate for opioid-induced sedation with appropriate assessment tool (i.e. POSS)     Problem: Neurological Deficit  Goal: Neurological status is stable or improving  Outcome: Progressing  Flowsheets (Taken 05/15/2023 2343 by Adrian Prows, RN)  Neurological status is stable or improving:   Monitor/assess/document neurological assessment (Stroke: every 4 hours)   Observe for seizure activity and initiate seizure precautions if indicated   Perform CAM Assessment
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
## 2023-05-26 ENCOUNTER — Telehealth (INDEPENDENT_AMBULATORY_CARE_PROVIDER_SITE_OTHER): Payer: Self-pay | Admitting: Surgery

## 2023-05-26 NOTE — Telephone Encounter (Signed)
Pt has an upcoming appointment with Dr. Chase Caller (05/30/23). Left VM informing pt to complete their X-RAY before their appointment, she can either go to  or [facility] to get this X-RAY completed.

## 2023-05-30 ENCOUNTER — Ambulatory Visit (INDEPENDENT_AMBULATORY_CARE_PROVIDER_SITE_OTHER): Payer: Self-pay | Admitting: Surgery

## 2023-05-31 ENCOUNTER — Ambulatory Visit (INDEPENDENT_AMBULATORY_CARE_PROVIDER_SITE_OTHER): Payer: Self-pay

## 2023-06-09 ENCOUNTER — Other Ambulatory Visit (INDEPENDENT_AMBULATORY_CARE_PROVIDER_SITE_OTHER): Payer: Self-pay | Admitting: Family Nurse Practitioner

## 2023-06-13 ENCOUNTER — Encounter (INDEPENDENT_AMBULATORY_CARE_PROVIDER_SITE_OTHER): Payer: Self-pay | Admitting: Trauma Surgery

## 2023-06-13 ENCOUNTER — Ambulatory Visit (INDEPENDENT_AMBULATORY_CARE_PROVIDER_SITE_OTHER): Payer: Self-pay | Admitting: Trauma Surgery

## 2023-06-13 VITALS — BP 126/80 | HR 102 | Temp 98.2°F | Ht 62.0 in | Wt 174.8 lb

## 2023-06-13 DIAGNOSIS — S2249XD Multiple fractures of ribs, unspecified side, subsequent encounter for fracture with routine healing: Secondary | ICD-10-CM
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
